# Patient Record
Sex: Female | Born: 1970
Health system: Southern US, Community
[De-identification: ages and names within clinical notes are randomized; demographics above are authoritative.]

## PROBLEM LIST (undated history)

## (undated) DIAGNOSIS — Z8619 Personal history of other infectious and parasitic diseases: Secondary | ICD-10-CM

## (undated) DIAGNOSIS — I10 Essential (primary) hypertension: Secondary | ICD-10-CM

## (undated) DIAGNOSIS — G250 Essential tremor: Secondary | ICD-10-CM

## (undated) DIAGNOSIS — E079 Disorder of thyroid, unspecified: Secondary | ICD-10-CM

## (undated) HISTORY — DX: Personal history of other infectious and parasitic diseases: Z86.19

## (undated) HISTORY — DX: Disorder of thyroid, unspecified: E07.9

## (undated) HISTORY — DX: Essential tremor: G25.0

## (undated) HISTORY — PX: BREAST ENHANCEMENT SURGERY: SHX7

## (undated) HISTORY — DX: Essential (primary) hypertension: I10

## (undated) HISTORY — PX: WISDOM TOOTH EXTRACTION: SHX21

---

## 1998-03-11 ENCOUNTER — Other Ambulatory Visit: Admission: RE | Admit: 1998-03-11 | Discharge: 1998-03-11 | Payer: Self-pay | Admitting: Obstetrics and Gynecology

## 1998-04-02 ENCOUNTER — Other Ambulatory Visit: Admission: RE | Admit: 1998-04-02 | Discharge: 1998-04-02 | Payer: Self-pay | Admitting: Obstetrics and Gynecology

## 1998-08-29 ENCOUNTER — Other Ambulatory Visit: Admission: RE | Admit: 1998-08-29 | Discharge: 1998-08-29 | Payer: Self-pay | Admitting: Obstetrics and Gynecology

## 1999-08-04 ENCOUNTER — Other Ambulatory Visit: Admission: RE | Admit: 1999-08-04 | Discharge: 1999-08-04 | Payer: Self-pay | Admitting: Obstetrics & Gynecology

## 2001-03-15 ENCOUNTER — Other Ambulatory Visit: Admission: RE | Admit: 2001-03-15 | Discharge: 2001-03-15 | Payer: Self-pay | Admitting: Obstetrics & Gynecology

## 2002-09-01 ENCOUNTER — Other Ambulatory Visit: Admission: RE | Admit: 2002-09-01 | Discharge: 2002-09-01 | Payer: Self-pay | Admitting: Obstetrics & Gynecology

## 2003-09-03 ENCOUNTER — Other Ambulatory Visit: Admission: RE | Admit: 2003-09-03 | Discharge: 2003-09-03 | Payer: Self-pay | Admitting: Obstetrics & Gynecology

## 2004-09-10 ENCOUNTER — Other Ambulatory Visit: Admission: RE | Admit: 2004-09-10 | Discharge: 2004-09-10 | Payer: Self-pay | Admitting: Obstetrics & Gynecology

## 2010-12-12 ENCOUNTER — Other Ambulatory Visit: Payer: Self-pay | Admitting: Obstetrics & Gynecology

## 2010-12-12 DIAGNOSIS — R928 Other abnormal and inconclusive findings on diagnostic imaging of breast: Secondary | ICD-10-CM

## 2010-12-22 ENCOUNTER — Ambulatory Visit
Admission: RE | Admit: 2010-12-22 | Discharge: 2010-12-22 | Disposition: A | Discharge status: Home / Self Care | Payer: BC Managed Care – PPO | Source: Ambulatory Visit | Attending: Obstetrics & Gynecology | Admitting: Obstetrics & Gynecology

## 2010-12-22 DIAGNOSIS — R928 Other abnormal and inconclusive findings on diagnostic imaging of breast: Secondary | ICD-10-CM

## 2011-12-02 ENCOUNTER — Other Ambulatory Visit: Payer: Self-pay | Admitting: Obstetrics & Gynecology

## 2011-12-02 DIAGNOSIS — N63 Unspecified lump in unspecified breast: Secondary | ICD-10-CM

## 2011-12-10 ENCOUNTER — Ambulatory Visit
Admission: RE | Admit: 2011-12-10 | Discharge: 2011-12-10 | Disposition: A | Discharge status: Home / Self Care | Payer: BC Managed Care – PPO | Source: Ambulatory Visit | Attending: Obstetrics & Gynecology | Admitting: Obstetrics & Gynecology

## 2011-12-10 DIAGNOSIS — N63 Unspecified lump in unspecified breast: Secondary | ICD-10-CM

## 2012-12-06 ENCOUNTER — Ambulatory Visit: Payer: BC Managed Care – PPO | Admitting: Internal Medicine

## 2012-12-08 ENCOUNTER — Ambulatory Visit (INDEPENDENT_AMBULATORY_CARE_PROVIDER_SITE_OTHER): Payer: BC Managed Care – PPO | Admitting: Internal Medicine

## 2012-12-08 ENCOUNTER — Other Ambulatory Visit (INDEPENDENT_AMBULATORY_CARE_PROVIDER_SITE_OTHER): Payer: BC Managed Care – PPO

## 2012-12-08 ENCOUNTER — Encounter: Payer: Self-pay | Admitting: Internal Medicine

## 2012-12-08 VITALS — BP 120/68 | HR 70 | Temp 99.8°F | Ht 64.0 in | Wt 131.2 lb

## 2012-12-08 DIAGNOSIS — Z23 Encounter for immunization: Secondary | ICD-10-CM

## 2012-12-08 DIAGNOSIS — Z1329 Encounter for screening for other suspected endocrine disorder: Secondary | ICD-10-CM

## 2012-12-08 DIAGNOSIS — Z130 Encounter for screening for diseases of the blood and blood-forming organs and certain disorders involving the immune mechanism: Secondary | ICD-10-CM

## 2012-12-08 DIAGNOSIS — H608X3 Other otitis externa, bilateral: Secondary | ICD-10-CM | POA: Insufficient documentation

## 2012-12-08 DIAGNOSIS — Z0000 Encounter for general adult medical examination without abnormal findings: Secondary | ICD-10-CM

## 2012-12-08 DIAGNOSIS — Z13220 Encounter for screening for lipoid disorders: Secondary | ICD-10-CM

## 2012-12-08 LAB — CBC
HCT: 39.2 % (ref 36.0–46.0)
Hemoglobin: 13.3 g/dL (ref 12.0–15.0)
MCHC: 34 g/dL (ref 30.0–36.0)
RDW: 13 % (ref 11.5–14.6)

## 2012-12-08 LAB — COMPREHENSIVE METABOLIC PANEL
ALT: 17 U/L (ref 0–35)
AST: 21 U/L (ref 0–37)
Alkaline Phosphatase: 27 U/L — ABNORMAL LOW (ref 39–117)
Creatinine, Ser: 0.8 mg/dL (ref 0.4–1.2)
Total Bilirubin: 1.1 mg/dL (ref 0.3–1.2)

## 2012-12-08 LAB — LIPID PANEL
HDL: 62.2 mg/dL (ref >39.00)
LDL Cholesterol: 118 mg/dL — ABNORMAL HIGH (ref 0–99)
Total CHOL/HDL Ratio: 3
VLDL: 15.4 mg/dL (ref 0.0–40.0)

## 2012-12-08 LAB — TSH: TSH: 3.7 u[IU]/mL (ref 0.35–5.50)

## 2012-12-08 MED ORDER — NEOMYCIN-POLYMYXIN-HC 1 % OT SOLN: 3.0000 [drp] | Freq: Four times a day (QID) | OTIC | Status: DC | PRN

## 2012-12-08 NOTE — Addendum Note (Signed)
Addended by: Edwena Felty T on: 12/08/2012 09:15 AM   Modules accepted: Orders

## 2012-12-08 NOTE — Patient Instructions (Signed)

## 2012-12-08 NOTE — Assessment & Plan Note (Signed)
eRx for Cortisporin ear drops

## 2012-12-08 NOTE — Progress Notes (Signed)
HPI  Pt presents to the clinic today to establish care. She has not seen a PCP in more than 10 years. She does have some concerns today about feeling itchy inside her ears. This has been going on for the last 6 months. She has not put anything in her ears. She has no history of allergies that she is aware of. She denies ear pain or drainage from the ears.  Flu: never Tetanus: more than 10 years ago LMP: 09/2012 Pap smear: 11/2011 Mammogram: 11/2011 Eye doctor: annually Dentist: annually  Past Medical History  Diagnosis Date  . History of chicken pox     Current Outpatient Prescriptions  Medication Sig Dispense Refill  . CAMRESE 0.15-0.03 &0.01 MG tablet Take 1 by mouth daily       No current facility-administered medications for this visit.    No Known Allergies  Family History  Problem Relation Age of Onset  . Hypertension Father   . Hyperlipidemia Father   . Hypertension Sister     History   Social History  . Marital Status: Unknown    Spouse Name: N/A    Number of Children: N/A  . Years of Education: N/A   Occupational History  . Not on file.   Social History Main Topics  . Smoking status: Former Smoker    Types: Cigarettes  . Smokeless tobacco: Not on file     Comment: Quit over 18 years ago  . Alcohol Use: Yes  . Drug Use: No  . Sexual Activity: Not on file   Other Topics Concern  . Not on file   Social History Narrative  . No narrative on file    ROS:  Constitutional: Denies fever, malaise, fatigue, headache or abrupt weight changes.  HEENT: Pt reports itchy ears. Denies eye pain, eye redness, ear pain, ringing in the ears, wax buildup, runny nose, nasal congestion, bloody nose, or sore throat. Respiratory: Denies difficulty breathing, shortness of breath, cough or sputum production.   Cardiovascular: Denies chest pain, chest tightness, palpitations or swelling in the hands or feet.  Gastrointestinal: Denies abdominal pain, bloating, constipation,  diarrhea or blood in the stool.  GU: Denies frequency, urgency, pain with urination, blood in urine, odor or discharge. Musculoskeletal: Denies decrease in range of motion, difficulty with gait, muscle pain or joint pain and swelling.  Skin: Denies redness, rashes, lesions or ulcercations.  Neurological: Denies dizziness, difficulty with memory, difficulty with speech or problems with balance and coordination.   No other specific complaints in a complete review of systems (except as listed in HPI above).  PE:  BP 120/68  Pulse 70  Temp(Src) 99.8 F (37.7 C) (Oral)  Ht 5\' 4"  (1.626 m)  Wt 131 lb 3.2 oz (59.512 kg)  BMI 22.51 kg/m2  SpO2 98% Wt Readings from Last 3 Encounters:  12/08/12 131 lb 3.2 oz (59.512 kg)    General: Appears her stated age, well developed, well nourished in NAD. HEENT: Head: normal shape and size; Eyes: sclera white, no icterus, conjunctiva pink, PERRLA and EOMs intact; Ears: dry skin noted in each ear canal, Tm's gray and intact, normal light reflex; Nose: mucosa pink and moist, septum midline; Throat/Mouth: Teeth present, mucosa pink and moist, no lesions or ulcerations noted.  Neck: Normal range of motion. Neck supple, trachea midline. No massses, lumps or thyromegaly present.  Cardiovascular: Normal rate and rhythm. S1,S2 noted.  No murmur, rubs or gallops noted. No JVD or BLE edema. No carotid bruits noted. Pulmonary/Chest: Normal  effort and positive vesicular breath sounds. No respiratory distress. No wheezes, rales or ronchi noted.  Abdomen: Soft and nontender. Normal bowel sounds, no bruits noted. No distention or masses noted. Liver, spleen and kidneys non palpable. Musculoskeletal: Normal range of motion. No signs of joint swelling. No difficulty with gait.  Neurological: Alert and oriented. Cranial nerves II-XII intact. Coordination normal. +DTRs bilaterally. Psychiatric: Mood and affect normal. Behavior is normal. Judgment and thought content normal.       Assessment and Plan:  Prevent Health Maintenance:  Pt declines flu shot today Will give Tdap All HM UTD Will obtain screening labs today  RTC in 1 year or sooner if needed

## 2012-12-09 ENCOUNTER — Encounter: Payer: Self-pay | Admitting: *Deleted

## 2014-08-02 ENCOUNTER — Ambulatory Visit (INDEPENDENT_AMBULATORY_CARE_PROVIDER_SITE_OTHER): Payer: BLUE CROSS/BLUE SHIELD | Admitting: Family

## 2014-08-02 ENCOUNTER — Encounter: Payer: Self-pay | Admitting: Family

## 2014-08-02 VITALS — BP 120/82 | HR 62 | Temp 98.5°F | Resp 18 | Ht 64.0 in | Wt 125.1 lb

## 2014-08-02 DIAGNOSIS — H60543 Acute eczematoid otitis externa, bilateral: Secondary | ICD-10-CM

## 2014-08-02 DIAGNOSIS — H608X3 Other otitis externa, bilateral: Secondary | ICD-10-CM

## 2014-08-02 MED ORDER — NEOMYCIN-POLYMYXIN-HC 1 % OT SOLN: 3.0000 [drp] | Freq: Four times a day (QID) | OTIC | Status: DC | PRN

## 2014-08-02 NOTE — Progress Notes (Signed)
Pre visit review using our clinic review tool, if applicable. No additional management support is needed unless otherwise documented below in the visit note. 

## 2014-08-02 NOTE — Progress Notes (Signed)
   Subjective:    Patient ID: Krystal Myers, female    DOB: 1970-08-20, 44 y.o.   MRN: [id number]  Chief Complaint  Patient presents with  . Establish Care    x3 weeks has been having trouble with her ears, more in the left ear than the right, hears muffled sounds and describes it as a cross between having water in her ears and the popping sounds    HPI:  Krystal Myers is a 44 y.o. female with a PMH of eczematous otitis externa  who presents today for an office visit to establish care with this provider.  1) Ears - Associated symptom changes in hearing located in both ears has been going on for about 3 weeks. Described hearing as muffled and occasionally feeling like she has water in her ears. Modifying factors include Zyrtec which did not help a lot. Occasionally notes liquid coming from her ear.                No Known Allergies  Current Outpatient Prescriptions on File Prior to Visit  Medication Sig Dispense Refill  . CAMRESE 0.15-0.03 &0.01 MG tablet Take 1 by mouth daily     No current facility-administered medications on file prior to visit.    Review of Systems  Constitutional: Negative for fever and chills.  HENT: Positive for ear discharge. Negative for congestion and ear pain.       Objective:    BP 120/82 mmHg  Pulse 62  Temp(Src) 98.5 F (36.9 C) (Oral)  Resp 18  Ht 5\' 4"  (1.626 m)  Wt 125 lb 1.9 oz (56.754 kg)  BMI 21.47 kg/m2  SpO2 99% Nursing note and vital signs reviewed.  Physical Exam  Constitutional: She is oriented to person, place, and time. She appears well-developed and well-nourished. No distress.  HENT:  Right Ear: Hearing, tympanic membrane, external ear and ear canal normal. No drainage or tenderness.  Left Ear: Hearing, tympanic membrane, external ear and ear canal normal. No drainage or tenderness.  Mild dryness noted bilaterally  Cardiovascular: Normal rate, regular rhythm, normal heart sounds and intact distal pulses.     Pulmonary/Chest: Effort normal and breath sounds normal.  Neurological: She is alert and oriented to person, place, and time.  Skin: Skin is warm and dry.  Psychiatric: She has a normal mood and affect. Her behavior is normal. Judgment and thought content normal.       Assessment & Plan:

## 2014-08-02 NOTE — Patient Instructions (Signed)
Thank you for choosing [facility].  Summary/Instructions:  Your prescription(s) have been submitted to your pharmacy or been printed and provided for you. Please take as directed and contact our office if you believe you are having problem(s) with the medication(s) or have any questions.  If your symptoms worsen or fail to improve, please contact our office for further instruction, or in case of emergency go directly to the emergency room at the closest medical facility.     

## 2014-08-02 NOTE — Assessment & Plan Note (Signed)
Patient continues to experience dryness and itching of both ears. Refill Cortisporin. Muffled sounds most likely related to eustachian tube dysfunction or increased fluid. Trial Flonase and second-generation antihistamine. If this is not improved symptoms, consider referral to ENT

## 2015-12-27 ENCOUNTER — Encounter: Payer: BLUE CROSS/BLUE SHIELD | Admitting: Family

## 2016-01-13 ENCOUNTER — Encounter: Payer: Self-pay | Admitting: Family

## 2016-01-13 ENCOUNTER — Ambulatory Visit (INDEPENDENT_AMBULATORY_CARE_PROVIDER_SITE_OTHER): Payer: BLUE CROSS/BLUE SHIELD | Admitting: Family

## 2016-01-13 DIAGNOSIS — Z0000 Encounter for general adult medical examination without abnormal findings: Secondary | ICD-10-CM | POA: Diagnosis not present

## 2016-01-13 NOTE — Assessment & Plan Note (Signed)
1) Anticipatory Guidance: Discussed importance of wearing a seatbelt while driving and not texting while driving; changing batteries in smoke detector at least once annually; wearing suntan lotion when outside; eating a balanced and moderate diet; getting physical activity at least 30 minutes per day.  2) Immunizations / Screenings / Labs:  Declines influenza. All other immunizations are up to date per recommendations. Obtain Vitamin D for Vitamin D deficiency screening. All other screenings are up to date per recommendations. Obtain CBC, CMET, and lipid profile.    Overall well exam with risk factors for cardiovascular disease being minimal at this time. She is of good weight. Is exercising on a regular basis. Continue healthy lifestyle behaviors and choices. Follow-up prevention exam in 1 year. Follow-up office visit pending blood work if necessary.

## 2016-01-13 NOTE — Progress Notes (Signed)
Subjective:    Patient ID: Krystal Myers, female    DOB: October 13, 1970, 45 y.o.   MRN: [id number]  Chief Complaint  Patient presents with  . CPE    not fasting    HPI:  Krystal Myers is a 45 y.o. female who presents today for an annual wellness visit.   1) Health Maintenance -   Diet - Averages about 3 meals per day with some snacks consisting of regular diet. Caffeine intake of 1-2 cups per week.   Exercise - Goes to the gym 6x per week with a mix of cardio and resistance.   2) Preventative Exams / Immunizations:  Dental -- Up to date  Vision -- Up to date   Health Maintenance  Topic Date Due  . HIV Screening  10/07/1983  . INFLUENZA VACCINE  06/27/2016 (Originally 10/29/2015)  . PAP SMEAR  01/28/2018  . TETANUS/TDAP  12/09/2022    Immunization History  Administered Date(s) Administered  . Tdap 12/08/2012     No Known Allergies   Outpatient Medications Prior to Visit  Medication Sig Dispense Refill  . CAMRESE 0.15-0.03 &0.01 MG tablet Take 1 by mouth daily    . NEOMYCIN-POLYMYXIN-HYDROCORTISONE (CORTISPORIN) 1 % SOLN otic solution Place 3 drops into both ears 4 (four) times daily as needed. 10 mL 1   No facility-administered medications prior to visit.      Past Medical History:  Diagnosis Date  . History of chicken pox      Past Surgical History:  Procedure Laterality Date  . BREAST ENHANCEMENT SURGERY       Family History  Problem Relation Age of Onset  . Hypertension Father   . Hyperlipidemia Father   . Hypertension Sister   . Healthy Mother   . Healthy Maternal Grandmother   . Healthy Maternal Grandfather   . Healthy Paternal Grandmother   . Healthy Paternal Grandfather      Social History   Social History  . Marital status: Single    Spouse name: N/A  . Number of children: 0  . Years of education: 14   Occupational History  . Supervisor    Social History Main Topics  . Smoking status: Former Smoker    Types:  Cigarettes  . Smokeless tobacco: Never Used     Comment: Quit over 18 years ago  . Alcohol use 0.6 oz/week    1 Glasses of wine per week  . Drug use: No  . Sexual activity: Yes    Birth control/ protection: Pill   Other Topics Concern  . Not on file   Social History Narrative   Fun: [employer] work, walk with her dogs   Denies abuse and feels safe at home.       Review of Systems  Constitutional: Denies fever, chills, fatigue, or significant weight gain/loss. HENT: Head: Denies headache or neck pain Ears: Denies changes in hearing, ringing in ears, earache, drainage Nose: Denies discharge, stuffiness, itching, nosebleed, sinus pain Throat: Denies sore throat, hoarseness, dry mouth, sores, thrush Eyes: Denies loss/changes in vision, pain, redness, blurry/double vision, flashing lights Cardiovascular: Denies chest pain/discomfort, tightness, palpitations, shortness of breath with activity, difficulty lying down, swelling, sudden awakening with shortness of breath Respiratory: Denies shortness of breath, cough, sputum production, wheezing Gastrointestinal: Denies dysphasia, heartburn, change in appetite, nausea, change in bowel habits, rectal bleeding, constipation, diarrhea, yellow skin or eyes Genitourinary: Denies frequency, urgency, burning/pain, blood in urine, incontinence, change in urinary strength. Musculoskeletal: Denies muscle/joint pain, stiffness, back pain,  redness or swelling of joints, trauma Skin: Denies rashes, lumps, itching, dryness, color changes, or hair/nail changes Neurological: Denies dizziness, fainting, seizures, weakness, numbness, tingling, tremor Psychiatric - Denies nervousness, stress, depression or memory loss Endocrine: Denies heat or cold intolerance, sweating, frequent urination, excessive thirst, changes in appetite Hematologic: Denies ease of bruising or bleeding     Objective:    BP 132/84 (BP Location: Left Arm, Patient Position: Sitting, Cuff  Size: Normal)   Pulse 71   Temp 98.3 F (36.8 C) (Oral)   Resp 16   Ht 5\' 4"  (1.626 m)   Wt 146 lb 12.8 oz (66.6 kg)   SpO2 99%   BMI 25.20 kg/m  Nursing note and vital signs reviewed.  Physical Exam  Constitutional: She is oriented to person, place, and time. She appears well-developed and well-nourished.  HENT:  Head: Normocephalic.  Right Ear: Hearing, tympanic membrane, external ear and ear canal normal.  Left Ear: Hearing, tympanic membrane, external ear and ear canal normal.  Nose: Nose normal.  Mouth/Throat: Uvula is midline, oropharynx is clear and moist and mucous membranes are normal.  Eyes: Conjunctivae and EOM are normal. Pupils are equal, round, and reactive to light.  Neck: Neck supple. No JVD present. No tracheal deviation present. No thyromegaly present.  Cardiovascular: Normal rate, regular rhythm, normal heart sounds and intact distal pulses.   Pulmonary/Chest: Effort normal and breath sounds normal.  Abdominal: Soft. Bowel sounds are normal. She exhibits no distension and no mass. There is no tenderness. There is no rebound and no guarding.  Musculoskeletal: Normal range of motion. She exhibits no edema or tenderness.  Lymphadenopathy:    She has no cervical adenopathy.  Neurological: She is alert and oriented to person, place, and time. She has normal reflexes. No cranial nerve deficit. She exhibits normal muscle tone. Coordination normal.  Skin: Skin is warm and dry.  Psychiatric: She has a normal mood and affect. Her behavior is normal. Judgment and thought content normal.       Assessment & Plan:   Problem List Items Addressed This Visit      Other   Routine general medical examination at a health care facility    1) Anticipatory Guidance: Discussed importance of wearing a seatbelt while driving and not texting while driving; changing batteries in smoke detector at least once annually; wearing suntan lotion when outside; eating a balanced and moderate  diet; getting physical activity at least 30 minutes per day.  2) Immunizations / Screenings / Labs:  Declines influenza. All other immunizations are up to date per recommendations. Obtain Vitamin D for Vitamin D deficiency screening. All other screenings are up to date per recommendations. Obtain CBC, CMET, and lipid profile.    Overall well exam with risk factors for cardiovascular disease being minimal at this time. She is of good weight. Is exercising on a regular basis. Continue healthy lifestyle behaviors and choices. Follow-up prevention exam in 1 year. Follow-up office visit pending blood work if necessary.       Relevant Orders   CBC   Comprehensive metabolic panel   Lipid panel   VITAMIN D 25 Hydroxy (Vit-D Deficiency, Fractures)    Other Visit Diagnoses   None.      I have discontinued Ms. Chamberland's NEOMYCIN-POLYMYXIN-HYDROCORTISONE. I am also having her maintain her CAMRESE.   Follow-up: Return in about 6 months (around 07/13/2016), or if symptoms worsen or fail to improve.   Mauricio Po, FNP

## 2016-01-13 NOTE — Patient Instructions (Addendum)
Thank you for choosing [facility].  SUMMARY AND INSTRUCTIONS:   Labs:  Please stop by the lab on the lower level of the building for your blood work. Your results will be released to MyChart (or called to you) after review, usually within 72 hours after test completion. If any changes need to be made, you will be notified at that same time.  1.) The lab is open from 7:30am to 5:30 pm Monday-Friday 2.) No appointment is necessary 3.) Fasting (if needed) is 6-8 hours after food and drink; black coffee and water are okay   Health Maintenance, Female Adopting a healthy lifestyle and getting preventive care can go a long way to promote health and wellness. Talk with your health care provider about what schedule of regular examinations is right for you. This is a good chance for you to check in with your provider about disease prevention and staying healthy. In between checkups, there are plenty of things you can do on your own. Experts have done a lot of research about which lifestyle changes and preventive measures are most likely to keep you healthy. Ask your health care provider for more information. WEIGHT AND DIET  Eat a healthy diet  Be sure to include plenty of vegetables, fruits, low-fat dairy products, and lean protein.  Do not eat a lot of foods high in solid fats, added sugars, or salt.  Get regular exercise. This is one of the most important things you can do for your health.  Most adults should exercise for at least 150 minutes each week. The exercise should increase your heart rate and make you sweat (moderate-intensity exercise).  Most adults should also do strengthening exercises at least twice a week. This is in addition to the moderate-intensity exercise.  Maintain a healthy weight  Body mass index (BMI) is a measurement that can be used to identify possible weight problems. It estimates body fat based on height and weight. Your health care provider can help  determine your BMI and help you achieve or maintain a healthy weight.  For females 20 years of age and older:   A BMI below 18.5 is considered underweight.  A BMI of 18.5 to 24.9 is normal.  A BMI of 25 to 29.9 is considered overweight.  A BMI of 30 and above is considered obese.  Watch levels of cholesterol and blood lipids  You should start having your blood tested for lipids and cholesterol at 47 years of age, then have this test every 5 years.  You may need to have your cholesterol levels checked more often if:  Your lipid or cholesterol levels are high.  You are older than 47 years of age.  You are at high risk for heart disease.  CANCER SCREENING   Lung Cancer  Lung cancer screening is recommended for adults 55-80 years old who are at high risk for lung cancer because of a history of smoking.  A yearly low-dose CT scan of the lungs is recommended for people who:  Currently smoke.  Have quit within the past 15 years.  Have at least a 30-pack-year history of smoking. A pack year is smoking an average of one pack of cigarettes a day for 1 year.  Yearly screening should continue until it has been 15 years since you quit.  Yearly screening should stop if you develop a health problem that would prevent you from having lung cancer treatment.  Breast Cancer  Practice breast self-awareness. This means understanding how your   breasts normally appear and feel.  It also means doing regular breast self-exams. Let your health care provider know about any changes, no matter how small.  If you are in your 20s or 30s, you should have a clinical breast exam (CBE) by a health care provider every 1-3 years as part of a regular health exam.  If you are 40 or older, have a CBE every year. Also consider having a breast X-ray (mammogram) every year.  If you have a family history of breast cancer, talk to your health care provider about genetic screening.  If you are at high risk  for breast cancer, talk to your health care provider about having an MRI and a mammogram every year.  Breast cancer gene (BRCA) assessment is recommended for women who have family members with BRCA-related cancers. BRCA-related cancers include:  Breast.  Ovarian.  Tubal.  Peritoneal cancers.  Results of the assessment will determine the need for genetic counseling and BRCA1 and BRCA2 testing. Cervical Cancer Your health care provider may recommend that you be screened regularly for cancer of the pelvic organs (ovaries, uterus, and vagina). This screening involves a pelvic examination, including checking for microscopic changes to the surface of your cervix (Pap test). You may be encouraged to have this screening done every 3 years, beginning at age 21.  For women ages 30-65, health care providers may recommend pelvic exams and Pap testing every 3 years, or they may recommend the Pap and pelvic exam, combined with testing for human papilloma virus (HPV), every 5 years. Some types of HPV increase your risk of cervical cancer. Testing for HPV may also be done on women of any age with unclear Pap test results.  Other health care providers may not recommend any screening for nonpregnant women who are considered low risk for pelvic cancer and who do not have symptoms. Ask your health care provider if a screening pelvic exam is right for you.  If you have had past treatment for cervical cancer or a condition that could lead to cancer, you need Pap tests and screening for cancer for at least 20 years after your treatment. If Pap tests have been discontinued, your risk factors (such as having a new sexual partner) need to be reassessed to determine if screening should resume. Some women have medical problems that increase the chance of getting cervical cancer. In these cases, your health care provider may recommend more frequent screening and Pap tests. Colorectal Cancer  This type of cancer can be  detected and often prevented.  Routine colorectal cancer screening usually begins at 47 years of age and continues through 47 years of age.  Your health care provider may recommend screening at an earlier age if you have risk factors for colon cancer.  Your health care provider may also recommend using home test kits to check for hidden blood in the stool.  A small camera at the end of a tube can be used to examine your colon directly (sigmoidoscopy or colonoscopy). This is done to check for the earliest forms of colorectal cancer.  Routine screening usually begins at age 50.  Direct examination of the colon should be repeated every 5-10 years through 47 years of age. However, you may need to be screened more often if early forms of precancerous polyps or small growths are found. Skin Cancer  Check your skin from head to toe regularly.  Tell your health care provider about any new moles or changes in moles, especially if   there is a change in a mole's shape or color.  Also tell your health care provider if you have a mole that is larger than the size of a pencil eraser.  Always use sunscreen. Apply sunscreen liberally and repeatedly throughout the day.  Protect yourself by wearing long sleeves, pants, a wide-brimmed hat, and sunglasses whenever you are outside. HEART DISEASE, DIABETES, AND HIGH BLOOD PRESSURE   High blood pressure causes heart disease and increases the risk of stroke. High blood pressure is more likely to develop in:  People who have blood pressure in the high end of the normal range (130-139/85-89 mm Hg).  People who are overweight or obese.  People who are African American.  If you are 18-39 years of age, have your blood pressure checked every 3-5 years. If you are 40 years of age or older, have your blood pressure checked every year. You should have your blood pressure measured twice--once when you are at a hospital or clinic, and once when you are not at a  hospital or clinic. Record the average of the two measurements. To check your blood pressure when you are not at a hospital or clinic, you can use:  An automated blood pressure machine at a pharmacy.  A home blood pressure monitor.  If you are between 55 years and 79 years old, ask your health care provider if you should take aspirin to prevent strokes.  Have regular diabetes screenings. This involves taking a blood sample to check your fasting blood sugar level.  If you are at a normal weight and have a low risk for diabetes, have this test once every three years after 47 years of age.  If you are overweight and have a high risk for diabetes, consider being tested at a younger age or more often. PREVENTING INFECTION  Hepatitis B  If you have a higher risk for hepatitis B, you should be screened for this virus. You are considered at high risk for hepatitis B if:  You were born in a country where hepatitis B is common. Ask your health care provider which countries are considered high risk.  Your parents were born in a high-risk country, and you have not been immunized against hepatitis B (hepatitis B vaccine).  You have HIV or AIDS.  You use needles to inject street drugs.  You live with someone who has hepatitis B.  You have had sex with someone who has hepatitis B.  You get hemodialysis treatment.  You take certain medicines for conditions, including cancer, organ transplantation, and autoimmune conditions. Hepatitis C  Blood testing is recommended for:  Everyone born from 1945 through 1965.  Anyone with known risk factors for hepatitis C. Sexually transmitted infections (STIs)  You should be screened for sexually transmitted infections (STIs) including gonorrhea and chlamydia if:  You are sexually active and are younger than 47 years of age.  You are older than 47 years of age and your health care provider tells you that you are at risk for this type of  infection.  Your sexual activity has changed since you were last screened and you are at an increased risk for chlamydia or gonorrhea. Ask your health care provider if you are at risk.  If you do not have HIV, but are at risk, it may be recommended that you take a prescription medicine daily to prevent HIV infection. This is called pre-exposure prophylaxis (PrEP). You are considered at risk if:  You are sexually active and do   not regularly use condoms or know the HIV status of your partner(s).  You take drugs by injection.  You are sexually active with a partner who has HIV. Talk with your health care provider about whether you are at high risk of being infected with HIV. If you choose to begin PrEP, you should first be tested for HIV. You should then be tested every 3 months for as long as you are taking PrEP.  PREGNANCY   If you are premenopausal and you may become pregnant, ask your health care provider about preconception counseling.  If you may become pregnant, take 400 to 800 micrograms (mcg) of folic acid every day.  If you want to prevent pregnancy, talk to your health care provider about birth control (contraception). OSTEOPOROSIS AND MENOPAUSE   Osteoporosis is a disease in which the bones lose minerals and strength with aging. This can result in serious bone fractures. Your risk for osteoporosis can be identified using a bone density scan.  If you are 65 years of age or older, or if you are at risk for osteoporosis and fractures, ask your health care provider if you should be screened.  Ask your health care provider whether you should take a calcium or vitamin D supplement to lower your risk for osteoporosis.  Menopause may have certain physical symptoms and risks.  Hormone replacement therapy may reduce some of these symptoms and risks. Talk to your health care provider about whether hormone replacement therapy is right for you.  HOME CARE INSTRUCTIONS   Schedule regular  health, dental, and eye exams.  Stay current with your immunizations.   Do not use any tobacco products including cigarettes, chewing tobacco, or electronic cigarettes.  If you are pregnant, do not drink alcohol.  If you are breastfeeding, limit how much and how often you drink alcohol.  Limit alcohol intake to no more than 1 drink per day for nonpregnant women. One drink equals 12 ounces of beer, 5 ounces of wine, or 1 ounces of hard liquor.  Do not use street drugs.  Do not share needles.  Ask your health care provider for help if you need support or information about quitting drugs.  Tell your health care provider if you often feel depressed.  Tell your health care provider if you have ever been abused or do not feel safe at home.   This information is not intended to replace advice given to you by your health care provider. Make sure you discuss any questions you have with your health care provider.   Document Released: 09/29/2010 Document Revised: 04/06/2014 Document Reviewed: 02/15/2013 Elsevier Interactive Patient Education 2016 [employer]   

## 2016-01-31 ENCOUNTER — Other Ambulatory Visit (INDEPENDENT_AMBULATORY_CARE_PROVIDER_SITE_OTHER): Payer: BLUE CROSS/BLUE SHIELD

## 2016-01-31 DIAGNOSIS — Z0000 Encounter for general adult medical examination without abnormal findings: Secondary | ICD-10-CM

## 2016-01-31 LAB — COMPREHENSIVE METABOLIC PANEL
ALK PHOS: 37 U/L — AB (ref 39–117)
ALT: 15 U/L (ref 0–35)
AST: 18 U/L (ref 0–37)
Albumin: 4 g/dL (ref 3.5–5.2)
BILIRUBIN TOTAL: 1 mg/dL (ref 0.2–1.2)
BUN: 19 mg/dL (ref 6–23)
CALCIUM: 9.3 mg/dL (ref 8.4–10.5)
CO2: 28 meq/L (ref 19–32)
Chloride: 106 mEq/L (ref 96–112)
Creatinine, Ser: 0.78 mg/dL (ref 0.40–1.20)
GFR: 84.03 mL/min (ref >60.00)
Glucose, Bld: 100 mg/dL — ABNORMAL HIGH (ref 70–99)
Potassium: 5 mEq/L (ref 3.5–5.1)
Sodium: 141 mEq/L (ref 135–145)
TOTAL PROTEIN: 7 g/dL (ref 6.0–8.3)

## 2016-01-31 LAB — CBC
HCT: 41.8 % (ref 36.0–46.0)
HEMOGLOBIN: 14.4 g/dL (ref 12.0–15.0)
MCHC: 34.5 g/dL (ref 30.0–36.0)
MCV: 93.4 fl (ref 78.0–100.0)
PLATELETS: 304 10*3/uL (ref 150.0–400.0)
RBC: 4.48 Mil/uL (ref 3.87–5.11)
RDW: 12.3 % (ref 11.5–15.5)
WBC: 6.8 10*3/uL (ref 4.0–10.5)

## 2016-01-31 LAB — LIPID PANEL
CHOLESTEROL: 219 mg/dL — AB (ref 0–200)
HDL: 71.4 mg/dL (ref >39.00)
LDL Cholesterol: 131 mg/dL — ABNORMAL HIGH (ref 0–99)
NonHDL: 148.03
TRIGLYCERIDES: 84 mg/dL (ref 0.0–149.0)
Total CHOL/HDL Ratio: 3
VLDL: 16.8 mg/dL (ref 0.0–40.0)

## 2016-01-31 LAB — VITAMIN D 25 HYDROXY (VIT D DEFICIENCY, FRACTURES): VITD: 57.53 ng/mL (ref 30.00–100.00)

## 2016-02-04 ENCOUNTER — Encounter: Payer: Self-pay | Admitting: Family

## 2018-04-20 DIAGNOSIS — R030 Elevated blood-pressure reading, without diagnosis of hypertension: Secondary | ICD-10-CM | POA: Insufficient documentation

## 2018-04-20 DIAGNOSIS — N393 Stress incontinence (female) (male): Secondary | ICD-10-CM | POA: Insufficient documentation

## 2018-10-05 ENCOUNTER — Ambulatory Visit (INDEPENDENT_AMBULATORY_CARE_PROVIDER_SITE_OTHER): Payer: 59 | Admitting: Family

## 2018-10-05 ENCOUNTER — Ambulatory Visit (INDEPENDENT_AMBULATORY_CARE_PROVIDER_SITE_OTHER)
Admission: RE | Admit: 2018-10-05 | Discharge: 2018-10-05 | Disposition: A | Discharge status: Home / Self Care | Payer: 59 | Source: Ambulatory Visit | Attending: Family | Admitting: Family

## 2018-10-05 ENCOUNTER — Other Ambulatory Visit: Payer: Self-pay

## 2018-10-05 ENCOUNTER — Other Ambulatory Visit (INDEPENDENT_AMBULATORY_CARE_PROVIDER_SITE_OTHER): Payer: 59

## 2018-10-05 ENCOUNTER — Encounter: Payer: Self-pay | Admitting: Family

## 2018-10-05 VITALS — BP 150/92 | HR 102 | Temp 98.5°F | Ht 64.0 in | Wt 162.8 lb

## 2018-10-05 DIAGNOSIS — R5383 Other fatigue: Secondary | ICD-10-CM

## 2018-10-05 DIAGNOSIS — Z0000 Encounter for general adult medical examination without abnormal findings: Secondary | ICD-10-CM | POA: Diagnosis not present

## 2018-10-05 DIAGNOSIS — G8929 Other chronic pain: Secondary | ICD-10-CM

## 2018-10-05 DIAGNOSIS — Z13220 Encounter for screening for lipoid disorders: Secondary | ICD-10-CM | POA: Diagnosis not present

## 2018-10-05 DIAGNOSIS — M5442 Lumbago with sciatica, left side: Secondary | ICD-10-CM | POA: Diagnosis not present

## 2018-10-05 LAB — COMPREHENSIVE METABOLIC PANEL
ALT: 14 U/L (ref 0–35)
AST: 16 U/L (ref 0–37)
Albumin: 4.1 g/dL (ref 3.5–5.2)
Alkaline Phosphatase: 43 U/L (ref 39–117)
BUN: 18 mg/dL (ref 6–23)
CO2: 25 mEq/L (ref 19–32)
Calcium: 8.7 mg/dL (ref 8.4–10.5)
Chloride: 103 mEq/L (ref 96–112)
Creatinine, Ser: 0.72 mg/dL (ref 0.40–1.20)
GFR: 85.74 mL/min (ref >60.00)
Glucose, Bld: 96 mg/dL (ref 70–99)
Potassium: 4.4 mEq/L (ref 3.5–5.1)
Sodium: 138 mEq/L (ref 135–145)
Total Bilirubin: 1 mg/dL (ref 0.2–1.2)
Total Protein: 7.2 g/dL (ref 6.0–8.3)

## 2018-10-05 LAB — CBC WITH DIFFERENTIAL/PLATELET
Basophils Absolute: 0.1 10*3/uL (ref 0.0–0.1)
Basophils Relative: 0.7 % (ref 0.0–3.0)
Eosinophils Absolute: 0.1 10*3/uL (ref 0.0–0.7)
Eosinophils Relative: 0.8 % (ref 0.0–5.0)
HCT: 43 % (ref 36.0–46.0)
Hemoglobin: 14.6 g/dL (ref 12.0–15.0)
Lymphocytes Relative: 26.2 % (ref 12.0–46.0)
Lymphs Abs: 2.2 10*3/uL (ref 0.7–4.0)
MCHC: 34.1 g/dL (ref 30.0–36.0)
MCV: 95.7 fl (ref 78.0–100.0)
Monocytes Absolute: 0.5 10*3/uL (ref 0.1–1.0)
Monocytes Relative: 5.7 % (ref 3.0–12.0)
Neutro Abs: 5.5 10*3/uL (ref 1.4–7.7)
Neutrophils Relative %: 66.6 % (ref 43.0–77.0)
Platelets: 293 10*3/uL (ref 150.0–400.0)
RBC: 4.49 Mil/uL (ref 3.87–5.11)
RDW: 12.2 % (ref 11.5–15.5)
WBC: 8.2 10*3/uL (ref 4.0–10.5)

## 2018-10-05 LAB — VITAMIN D 25 HYDROXY (VIT D DEFICIENCY, FRACTURES): VITD: 32.06 ng/mL (ref 30.00–100.00)

## 2018-10-05 LAB — LIPID PANEL
Cholesterol: 234 mg/dL — ABNORMAL HIGH (ref 0–200)
HDL: 77 mg/dL (ref >39.00)
LDL Cholesterol: 125 mg/dL — ABNORMAL HIGH (ref 0–99)
NonHDL: 156.85
Total CHOL/HDL Ratio: 3
Triglycerides: 158 mg/dL — ABNORMAL HIGH (ref 0.0–149.0)
VLDL: 31.6 mg/dL (ref 0.0–40.0)

## 2018-10-05 LAB — TSH: TSH: 3.75 u[IU]/mL (ref 0.35–4.50)

## 2018-10-05 MED ORDER — NEOMYCIN-POLYMYXIN-HC 3.5-10000-1 OT SOLN: 3.0000 [drp] | Freq: Four times a day (QID) | OTIC | 1 refills | Status: DC

## 2018-10-05 NOTE — Progress Notes (Signed)
Krystal Myers is a 48 y.o. female with the following history as recorded in [facility]:  Patient Active Problem List   Diagnosis Date Noted  . Routine general medical examination at a health care facility 01/13/2016  . Chronic eczematous otitis externa of both ears 12/08/2012    Current Outpatient Medications  Medication Sig Dispense Refill  . CAMRESE 0.15-0.03 &0.01 MG tablet Take 1 by mouth daily     No current facility-administered medications for this visit.     Allergies: Patient has no known allergies.  Past Medical History:  Diagnosis Date  . History of chicken pox     Past Surgical History:  Procedure Laterality Date  . BREAST ENHANCEMENT SURGERY      Family History  Problem Relation Age of Onset  . Hypertension Father   . Hyperlipidemia Father   . Hypertension Sister   . Healthy Mother   . Healthy Maternal Grandmother   . Healthy Maternal Grandfather   . Healthy Paternal Grandmother   . Healthy Paternal Grandfather     Social History   Tobacco Use  . Smoking status: Former Smoker    Types: Cigarettes  . Smokeless tobacco: Never Used  . Tobacco comment: Quit over 18 years ago  Substance Use Topics  . Alcohol use: Yes    Alcohol/week: 1.0 standard drinks    Types: 1 Glasses of wine per week    Subjective:  Patient has not been seen in our office since 12/2015; has been having majority of healthcare needs managed by GYN; just wants to get back on track with healthcare needs; GYN did mention in January that needed to change OCPs- recommended that she switch to a progesterone only pill; patient has not started the new progesterone only pill yet;  Also concerned that ears are itching frequently- has been told she has eczema in the past;  Does see dentist and eye doctor regularly; currently wearing Invisalign;  Review of Systems  Constitutional: Positive for malaise/fatigue.  HENT: Positive for ear discharge.   Eyes: Negative.   Respiratory: Negative for  cough and shortness of breath.   Cardiovascular: Negative for chest pain.  Gastrointestinal: Negative for abdominal pain.  Genitourinary: Negative.   Musculoskeletal: Positive for back pain.  Skin: Negative.   Neurological: Negative for dizziness and headaches.  Endo/Heme/Allergies: Negative.   Psychiatric/Behavioral: Negative for depression. The patient is not nervous/anxious and does not have insomnia.        Objective:  Vitals:   10/05/18 0859  BP: (!) 150/92  Pulse: (!) 102  Temp: 98.5 F (36.9 C)  TempSrc: Oral  SpO2: 98%  Weight: 162 lb 12.8 oz (73.8 kg)  Height: 5' 4" (1.626 m)    General: Well developed, well nourished, in no acute distress  Skin : Warm and dry.  Head: Normocephalic and atraumatic  Eyes: Sclera and conjunctiva clear; pupils round and reactive to light; extraocular movements intact  Ears: External normal; canals clear; tympanic membranes normal  Oropharynx: Pink, supple. No suspicious lesions  Neck: Supple without thyromegaly, adenopathy  Lungs: Respirations unlabored; clear to auscultation bilaterally without wheeze, rales, rhonchi  CVS exam: normal rate and regular rhythm.  Abdomen: Soft; nontender; nondistended; normoactive bowel sounds; no masses or hepatosplenomegaly  Musculoskeletal: No deformities; no active joint inflammation  Extremities: No edema, cyanosis, clubbing  Vessels: Symmetric bilaterally  Neurologic: Alert and oriented; speech intact; face symmetrical; moves all extremities well; CNII-XII intact without focal deficit   Assessment:  1. PE (physical exam), annual  2. Lipid screening   3. Other fatigue   4. Chronic left-sided low back pain with left-sided sciatica     Plan:  Age appropriate preventive healthcare needs addressed; encouraged regular eye doctor and dental exams; encouraged regular exercise; will update labs and refills as needed today; follow-up to be determined; She is to d/c OCP and change to progesterone only  pill as previously recommended by GYN; start checking blood pressure regularly and follow-up in 2 months; DASH diet discussed as well; may need to start blood pressure medication; Update lumbar X -ray- follow-up to be determined.  Discussed Cologuard vs colonoscopy- patient wants to consider options and will discuss at next OV.    Return in about 8 weeks (around 11/30/2018).  Orders Placed This Encounter  Procedures  . DG Lumbar Spine 2-3 Views    Standing Status:   Future    Standing Expiration Date:   12/06/2019    Order Specific Question:   Reason for Exam (SYMPTOM  OR DIAGNOSIS REQUIRED)    Answer:   low back pain    Order Specific Question:   Is patient pregnant?    Answer:   No    Order Specific Question:   Preferred imaging location?    Answer:   [facility]    Order Specific Question:   Radiology Contrast Protocol - do NOT remove file path    Answer:   [email or web address]  . CBC w/Diff    Standing Status:   Future    Standing Expiration Date:   10/05/2019  . Comp Met (CMET)    Standing Status:   Future    Standing Expiration Date:   10/05/2019  . Lipid panel    Standing Status:   Future    Standing Expiration Date:   10/05/2019  . TSH    Standing Status:   Future    Standing Expiration Date:   10/05/2019  . Vitamin D (25 hydroxy)    Standing Status:   Future    Standing Expiration Date:   10/05/2019    Requested Prescriptions    No prescriptions requested or ordered in this encounter

## 2018-10-05 NOTE — Patient Instructions (Addendum)
OMRON blood pressure cuff- arm cuff;  Please switch your birth control as we discussed;      DASH Eating Plan DASH stands for "Dietary Approaches to Stop Hypertension." The DASH eating plan is a healthy eating plan that has been shown to reduce high blood pressure (hypertension). It may also reduce your risk for type 2 diabetes, heart disease, and stroke. The DASH eating plan may also help with weight loss. What are tips for following this plan?  General guidelines  Avoid eating more than 2,300 mg (milligrams) of salt (sodium) a day. If you have hypertension, you may need to reduce your sodium intake to 1,500 mg a day.  Limit alcohol intake to no more than 1 drink a day for nonpregnant women and 2 drinks a day for men. One drink equals 12 oz of beer, 5 oz of wine, or 1 oz of hard liquor.  Work with your health care provider to maintain a healthy body weight or to lose weight. Ask what an ideal weight is for you.  Get at least 30 minutes of exercise that causes your heart to beat faster (aerobic exercise) most days of the week. Activities may include walking, swimming, or biking.  Work with your health care provider or diet and nutrition specialist (dietitian) to adjust your eating plan to your individual calorie needs. Reading food labels   Check food labels for the amount of sodium per serving. Choose foods with less than 5 percent of the Daily Value of sodium. Generally, foods with less than 300 mg of sodium per serving fit into this eating plan.  To find whole grains, look for the word "whole" as the first word in the ingredient list. Shopping  Buy products labeled as "low-sodium" or "no salt added."  Buy fresh foods. Avoid canned foods and premade or frozen meals. Cooking  Avoid adding salt when cooking. Use salt-free seasonings or herbs instead of table salt or sea salt. Check with your health care provider or pharmacist before using salt substitutes.  Do not fry foods. Cook  foods using healthy methods such as baking, boiling, grilling, and broiling instead.  Cook with heart-healthy oils, such as olive, canola, soybean, or sunflower oil. Meal planning  Eat a balanced diet that includes: ? 5 or more servings of fruits and vegetables each day. At each meal, try to fill half of your plate with fruits and vegetables. ? Up to 6-8 servings of whole grains each day. ? Less than 6 oz of lean meat, poultry, or fish each day. A 3-oz serving of meat is about the same size as a deck of cards. One egg equals 1 oz. ? 2 servings of low-fat dairy each day. ? A serving of nuts, seeds, or beans 5 times each week. ? Heart-healthy fats. Healthy fats called Omega-3 fatty acids are found in foods such as flaxseeds and coldwater fish, like sardines, salmon, and mackerel.  Limit how much you eat of the following: ? Canned or prepackaged foods. ? Food that is high in trans fat, such as fried foods. ? Food that is high in saturated fat, such as fatty meat. ? Sweets, desserts, sugary drinks, and other foods with added sugar. ? Full-fat dairy products.  Do not salt foods before eating.  Try to eat at least 2 vegetarian meals each week.  Eat more home-cooked food and less restaurant, buffet, and fast food.  When eating at a restaurant, ask that your food be prepared with less salt or no salt,  if possible. What foods are recommended? The items listed may not be a complete list. Talk with your dietitian about what dietary choices are best for you. Grains Whole-grain or whole-wheat bread. Whole-grain or whole-wheat pasta. Brown rice. [place]ena Morrow. Bulgur. Whole-grain and low-sodium cereals. Pita bread. Low-fat, low-sodium crackers. Whole-wheat flour tortillas. Vegetables Fresh or frozen vegetables (raw, steamed, roasted, or grilled). Low-sodium or reduced-sodium tomato and vegetable juice. Low-sodium or reduced-sodium tomato sauce and tomato paste. Low-sodium or reduced-sodium canned  vegetables. Fruits All fresh, dried, or frozen fruit. Canned fruit in natural juice (without added sugar). Meat and other protein foods Skinless chicken or [place]. Ground chicken or [place]. Pork with fat trimmed off. Fish and seafood. Egg whites. Dried beans, peas, or lentils. Unsalted nuts, nut butters, and seeds. Unsalted canned beans. Lean cuts of beef with fat trimmed off. Low-sodium, lean deli meat. Dairy Low-fat (1%) or fat-free (skim) milk. Fat-free, low-fat, or reduced-fat cheeses. Nonfat, low-sodium ricotta or cottage cheese. Low-fat or nonfat yogurt. Low-fat, low-sodium cheese. Fats and oils Soft margarine without trans fats. Vegetable oil. Low-fat, reduced-fat, or light mayonnaise and salad dressings (reduced-sodium). Canola, safflower, olive, soybean, and sunflower oils. Avocado. Seasoning and other foods Herbs. Spices. Seasoning mixes without salt. Unsalted popcorn and pretzels. Fat-free sweets. What foods are not recommended? The items listed may not be a complete list. Talk with your dietitian about what dietary choices are best for you. Grains Baked goods made with fat, such as croissants, muffins, or some breads. Dry pasta or rice meal packs. Vegetables Creamed or fried vegetables. Vegetables in a cheese sauce. Regular canned vegetables (not low-sodium or reduced-sodium). Regular canned tomato sauce and paste (not low-sodium or reduced-sodium). Regular tomato and vegetable juice (not low-sodium or reduced-sodium). Angie Fava. Olives. Fruits Canned fruit in a light or heavy syrup. Fried fruit. Fruit in cream or butter sauce. Meat and other protein foods Fatty cuts of meat. Ribs. Fried meat. Berniece Salines. Sausage. Bologna and other processed lunch meats. Salami. Fatback. Hotdogs. Bratwurst. Salted nuts and seeds. Canned beans with added salt. Canned or smoked fish. Whole eggs or egg yolks. Chicken or [place] with skin. Dairy Whole or 2% milk, cream, and half-and-half. Whole or full-fat  cream cheese. Whole-fat or sweetened yogurt. Full-fat cheese. Nondairy creamers. Whipped toppings. Processed cheese and cheese spreads. Fats and oils Butter. Stick margarine. Lard. Shortening. Ghee. Bacon fat. Tropical oils, such as coconut, palm kernel, or palm oil. Seasoning and other foods Salted popcorn and pretzels. Onion salt, garlic salt, seasoned salt, table salt, and sea salt. Worcestershire sauce. Tartar sauce. Barbecue sauce. Teriyaki sauce. Soy sauce, including reduced-sodium. Steak sauce. Canned and packaged gravies. Fish sauce. Oyster sauce. Cocktail sauce. Horseradish that you find on the shelf. Ketchup. Mustard. Meat flavorings and tenderizers. Bouillon cubes. Hot sauce and Tabasco sauce. Premade or packaged marinades. Premade or packaged taco seasonings. Relishes. Regular salad dressings. Where to find more information:  National Heart, Lung, and [place]: https://wilson-eaton.com/  American Heart Association: www.heart.org Summary  The DASH eating plan is a healthy eating plan that has been shown to reduce high blood pressure (hypertension). It may also reduce your risk for type 2 diabetes, heart disease, and stroke.  With the DASH eating plan, you should limit salt (sodium) intake to 2,300 mg a day. If you have hypertension, you may need to reduce your sodium intake to 1,500 mg a day.  When on the DASH eating plan, aim to eat more fresh fruits and vegetables, whole grains, lean proteins, low-fat dairy, and heart-healthy fats.  Work with your health care provider or diet and nutrition specialist (dietitian) to adjust your eating plan to your individual calorie needs. This information is not intended to replace advice given to you by your health care provider. Make sure you discuss any questions you have with your health care provider. Document Released: 03/05/2011 Document Revised: 02/26/2017 Document Reviewed: 03/09/2016 Elsevier Patient Education  2020 [employer].

## 2018-11-30 ENCOUNTER — Ambulatory Visit (INDEPENDENT_AMBULATORY_CARE_PROVIDER_SITE_OTHER): Payer: 59 | Admitting: Family

## 2018-11-30 ENCOUNTER — Other Ambulatory Visit: Payer: Self-pay

## 2018-11-30 ENCOUNTER — Encounter: Payer: Self-pay | Admitting: Family

## 2018-11-30 ENCOUNTER — Telehealth: Payer: Self-pay

## 2018-11-30 VITALS — BP 140/84 | HR 80 | Temp 98.1°F | Ht 64.0 in | Wt 163.6 lb

## 2018-11-30 DIAGNOSIS — R030 Elevated blood-pressure reading, without diagnosis of hypertension: Secondary | ICD-10-CM

## 2018-11-30 DIAGNOSIS — Z1211 Encounter for screening for malignant neoplasm of colon: Secondary | ICD-10-CM | POA: Diagnosis not present

## 2018-11-30 DIAGNOSIS — N393 Stress incontinence (female) (male): Secondary | ICD-10-CM | POA: Diagnosis not present

## 2018-11-30 DIAGNOSIS — M5432 Sciatica, left side: Secondary | ICD-10-CM

## 2018-11-30 MED ORDER — LOSARTAN POTASSIUM 50 MG PO TABS: 50.0000 mg | ORAL_TABLET | Freq: Every day | ORAL | 0 refills | Status: DC

## 2018-11-30 MED ORDER — METHYLPREDNISOLONE 4 MG PO TBPK: ORAL_TABLET | ORAL | 0 refills | Status: DC

## 2018-11-30 MED ORDER — HYDROCHLOROTHIAZIDE 25 MG PO TABS
25.0000 mg | ORAL_TABLET | Freq: Every day | ORAL | 1 refills | Status: DC
Start: 2018-11-30 — End: 2018-11-30

## 2018-11-30 NOTE — Progress Notes (Signed)
Krystal Myers is a 48 y.o. female with the following history as recorded in EpicCare:  Patient Active Problem List   Diagnosis Date Noted  . Routine general medical examination at a health care facility 01/13/2016  . Chronic eczematous otitis externa of both ears 12/08/2012    Current Outpatient Medications  Medication Sig Dispense Refill  . neomycin-polymyxin-hydrocortisone (CORTISPORIN) OTIC solution Place 3 drops into both ears 4 (four) times daily. 10 mL 1  . norethindrone (MICRONOR) 0.35 MG tablet Take 1 tablet by mouth daily.    Marland Kitchen losartan (COZAAR) 50 MG tablet Take 1 tablet (50 mg total) by mouth daily. 90 tablet 0  . methylPREDNISolone (MEDROL DOSEPAK) 4 MG TBPK tablet Taper as directed 21 tablet 0   No current facility-administered medications for this visit.     Allergies: Patient has no known allergies.  Past Medical History:  Diagnosis Date  . History of chicken pox     Past Surgical History:  Procedure Laterality Date  . BREAST ENHANCEMENT SURGERY      Family History  Problem Relation Age of Onset  . Hypertension Father   . Hyperlipidemia Father   . Hypertension Sister   . Healthy Mother   . Healthy Maternal Grandmother   . Healthy Maternal Grandfather   . Healthy Paternal Grandmother   . Healthy Paternal Grandfather     Social History   Tobacco Use  . Smoking status: Former Smoker    Types: Cigarettes  . Smokeless tobacco: Never Used  . Tobacco comment: Quit over 18 years ago  Substance Use Topics  . Alcohol use: Yes    Alcohol/week: 1.0 standard drinks    Types: 1 Glasses of wine per week    Subjective:  Follow-up on elevated blood pressure; since last OV, has changed to progesterone only birth control; blood pressure has improved but still not normalized; Denies any chest pain, shortness of breath, blurred vision or headache. Notes that she would like to re-start exercising regularly but has problems with urinary urgency/ incontinence; has seen  urology in the past but did not feel that pelvic floor PT was beneficial; knows that exercise will help with weight loss goals; Wants to review X-ray done at last OV; does occasionally have radiating pain down into her left leg; no specific injury or trauma;     Objective:  Vitals:   11/30/18 0855  BP: 140/84  Pulse: 80  Temp: 98.1 F (36.7 C)  TempSrc: Oral  SpO2: 97%  Weight: 163 lb 9.6 oz (74.2 kg)  Height: 5\' 4"  (1.626 m)    General: Well developed, well nourished, in no acute distress  Skin : Warm and dry.  Head: Normocephalic and atraumatic  Lungs: Respirations unlabored; clear to auscultation bilaterally without wheeze, rales, rhonchi  CVS exam: normal rate and regular rhythm.  Musculoskeletal: No deformities; no active joint inflammation  Extremities: No edema, cyanosis, clubbing  Vessels: Symmetric bilaterally  Neurologic: Alert and oriented; speech intact; face symmetrical; moves all extremities well; CNII-XII intact without focal deficit   Assessment:  1. Elevated blood pressure reading   2. Stress incontinence of urine   3. Sciatica of left side   4. Colon cancer screening     Plan:  1. Trial of Losartan 50 mg daily; discussed that would most likely be able to stop medication with weight loss goals; follow-up in 3 months; 2. Refer back to urology; need to find treatments that would allow her to exercise; has not responded to PT and Myrbetriq;  3. Rx for Medrol Dose pak- follow-up if symptoms persist and will refer to sports medicine. 4. Cologuard ordered;  Return in about 3 months (around 03/01/2019).  Orders Placed This Encounter  Procedures  . Ambulatory referral to Urology    Referral Priority:   Routine    Referral Type:   Consultation    Referral Reason:   Specialty Services Required    Requested Specialty:   Urology    Number of Visits Requested:   1    Requested Prescriptions   Signed Prescriptions Disp Refills  . methylPREDNISolone (MEDROL  DOSEPAK) 4 MG TBPK tablet 21 tablet 0    Sig: Taper as directed  . losartan (COZAAR) 50 MG tablet 90 tablet 0    Sig: Take 1 tablet (50 mg total) by mouth daily.

## 2018-11-30 NOTE — Telephone Encounter (Signed)
Ordered today 

## 2018-12-08 ENCOUNTER — Telehealth: Payer: Self-pay

## 2018-12-08 NOTE — Telephone Encounter (Signed)
Appointment scheduled.

## 2018-12-18 LAB — COLOGUARD: Cologuard: NEGATIVE

## 2018-12-20 ENCOUNTER — Encounter: Payer: Self-pay | Admitting: Family

## 2018-12-20 NOTE — Progress Notes (Signed)
Outside notes received. Information abstracted. Notes sent to scan.  

## 2018-12-30 ENCOUNTER — Ambulatory Visit: Payer: 59 | Admitting: Family Medicine

## 2019-01-11 ENCOUNTER — Ambulatory Visit: Payer: 59 | Admitting: Family Medicine

## 2019-02-27 ENCOUNTER — Other Ambulatory Visit: Payer: Self-pay | Admitting: Family

## 2019-03-01 ENCOUNTER — Ambulatory Visit (INDEPENDENT_AMBULATORY_CARE_PROVIDER_SITE_OTHER): Payer: 59 | Admitting: Family

## 2019-03-01 ENCOUNTER — Encounter: Payer: Self-pay | Admitting: Family

## 2019-03-01 VITALS — BP 128/78 | HR 84 | Temp 98.2°F | Ht 64.0 in | Wt 164.8 lb

## 2019-03-01 DIAGNOSIS — I10 Essential (primary) hypertension: Secondary | ICD-10-CM

## 2019-03-01 MED ORDER — LOSARTAN POTASSIUM 50 MG PO TABS: 50.0000 mg | ORAL_TABLET | Freq: Every day | ORAL | 1 refills | Status: DC

## 2019-03-01 NOTE — Progress Notes (Signed)
  Krystal Myers is a 48 y.o. female with the following history as recorded in EpicCare:  Patient Active Problem List   Diagnosis Date Noted  . Routine general medical examination at a health care facility 01/13/2016  . Chronic eczematous otitis externa of both ears 12/08/2012    Current Outpatient Medications  Medication Sig Dispense Refill  . losartan (COZAAR) 50 MG tablet Take 1 tablet (50 mg total) by mouth daily. 90 tablet 1  . neomycin-polymyxin-hydrocortisone (CORTISPORIN) OTIC solution Place 3 drops into both ears 4 (four) times daily. 10 mL 1  . norethindrone (MICRONOR) 0.35 MG tablet Take 1 tablet by mouth daily.     No current facility-administered medications for this visit.     Allergies: Patient has no known allergies.  Past Medical History:  Diagnosis Date  . History of chicken pox     Past Surgical History:  Procedure Laterality Date  . BREAST ENHANCEMENT SURGERY      Family History  Problem Relation Age of Onset  . Hypertension Father   . Hyperlipidemia Father   . Hypertension Sister   . Healthy Mother   . Healthy Maternal Grandmother   . Healthy Maternal Grandfather   . Healthy Paternal Grandmother   . Healthy Paternal Grandfather     Social History   Tobacco Use  . Smoking status: Former Smoker    Types: Cigarettes  . Smokeless tobacco: Never Used  . Tobacco comment: Quit over 18 years ago  Substance Use Topics  . Alcohol use: Yes    Alcohol/week: 1.0 standard drinks    Types: 1 Glasses of wine per week    Subjective:  2 month follow-up on recent start of Losartan; notes she has been feeling much better since starting the medication; increased energy/ fatigue; very excited with response; Denies any chest pain, shortness of breath, blurred vision or headache.  Mammogram and pap smear are scheduled for January 2021;     Objective:  Vitals:   03/01/19 0854  BP: 128/78  Pulse: 84  Temp: 98.2 F (36.8 C)  TempSrc: Oral  SpO2: 99%  Weight:  164 lb 12.8 oz (74.8 kg)  Height: 5\' 4"  (1.626 m)    General: Well developed, well nourished, in no acute distress  Skin : Warm and dry.  Head: Normocephalic and atraumatic  Lungs: Respirations unlabored; clear to auscultation bilaterally without wheeze, rales, rhonchi  CVS exam: normal rate and regular rhythm.  Musculoskeletal: No deformities; no active joint inflammation; ganglion cyst noted on inner left wrist, right dorsal wrist  Extremities: No edema, cyanosis, clubbing  Vessels: Symmetric bilaterally  Neurologic: Alert and oriented; speech intact; face symmetrical; moves all extremities well; CNII-XII intact without focal deficit   Assessment:  1. Essential hypertension     Plan:  Stable; good response to medication; refill updated; follow- up in 6 months.   This visit occurred during the SARS-CoV-2 public health emergency.  Safety protocols were in place, including screening questions prior to the visit, additional usage of staff PPE, and extensive cleaning of exam room while observing appropriate contact time as indicated for disinfecting solutions.    Return in about 6 months (around 08/30/2019).  No orders of the defined types were placed in this encounter.   Requested Prescriptions   Signed Prescriptions Disp Refills  . losartan (COZAAR) 50 MG tablet 90 tablet 1    Sig: Take 1 tablet (50 mg total) by mouth daily.

## 2019-03-01 NOTE — Patient Instructions (Signed)
Ganglion Cyst  A ganglion cyst is a non-cancerous, fluid-filled lump that occurs near a joint or tendon. The cyst grows out of a joint or the lining of a tendon. Ganglion cysts most often develop in the hand or wrist, but they can also develop in the shoulder, elbow, hip, knee, ankle, or foot. Ganglion cysts are ball-shaped or egg-shaped. Their size can range from the size of a pea to larger than a grape. Increased activity may cause the cyst to get bigger because more fluid starts to build up. What are the causes? The exact cause of this condition is not known, but it may be related to:  Inflammation or irritation around the joint.  An injury.  Repetitive movements or overuse.  Arthritis. What increases the risk? You are more likely to develop this condition if:  You are a woman.  You are 40-59 years old. What are the signs or symptoms? The main symptom of this condition is a lump. It most often appears on the hand or wrist. In many cases, there are no other symptoms, but a cyst can sometimes cause:  Tingling.  Pain.  Numbness.  Muscle weakness.  Weak grip.  Less range of motion in a joint. How is this diagnosed? Ganglion cysts are usually diagnosed based on a physical exam. Your health care provider will feel the lump and may shine a light next to it. If it is a ganglion cyst, the light will likely shine through it. Your health care provider may order an X-ray, ultrasound, or MRI to rule out other conditions. How is this treated? Ganglion cysts often go away on their own without treatment. If you have pain or other symptoms, treatment may be needed. Treatment is also needed if the ganglion cyst limits your movement or if it gets infected. Treatment may include:  Wearing a brace or splint on your wrist or finger.  Taking anti-inflammatory medicine.  Having fluid drained from the lump with a needle (aspiration).  Getting a steroid injected into the joint.  Having  surgery to remove the ganglion cyst.  Placing a pad on your shoe or wearing shoes that will not rub against the cyst if it is on your foot. Follow these instructions at home:  Do not press on the ganglion cyst, poke it with a needle, or hit it.  Take over-the-counter and prescription medicines only as told by your health care provider.  If you have a brace or splint: ? Wear it as told by your health care provider. ? Remove it as told by your health care provider. Ask if you need to remove it when you take a shower or a bath.  Watch your ganglion cyst for any changes.  Keep all follow-up visits as told by your health care provider. This is important. Contact a health care provider if:  Your ganglion cyst becomes larger or more painful.  You have pus coming from the lump.  You have weakness or numbness in the affected area.  You have a fever or chills. Get help right away if:  You have a fever and have any of these in the cyst area: ? Increased redness. ? Red streaks. ? Swelling. Summary  A ganglion cyst is a non-cancerous, fluid-filled lump that occurs near a joint or tendon.  Ganglion cysts most often develop in the hand or wrist, but they can also develop in the shoulder, elbow, hip, knee, ankle, or foot.  Ganglion cysts often go away on their own without treatment.  This information is not intended to replace advice given to you by your health care provider. Make sure you discuss any questions you have with your health care provider. Document Released: 03/13/2000 Document Revised: 02/26/2017 Document Reviewed: 11/13/2016 Elsevier Patient Education  2020 [employer].

## 2019-06-23 ENCOUNTER — Telehealth: Payer: Self-pay | Admitting: Family

## 2019-06-23 ENCOUNTER — Other Ambulatory Visit: Payer: Self-pay | Admitting: Family

## 2019-06-23 MED ORDER — LOSARTAN POTASSIUM 50 MG PO TABS: 50.0000 mg | ORAL_TABLET | Freq: Every day | ORAL | 1 refills | Status: DC

## 2019-06-23 NOTE — Telephone Encounter (Signed)
    1.Medication Requested:losartan (COZAAR) 50 MG tablet  2. Pharmacy (Name, Street, City):[id number] - [place], [place] - [address]  3. On Med List: yes  4. Last Visit with PCP: 03/01/19  5. Next visit date with PCP: 08/30/19   Agent: Please be advised that RX refills may take up to 3 business days. We ask that you follow-up with your pharmacy.

## 2019-08-30 ENCOUNTER — Ambulatory Visit (INDEPENDENT_AMBULATORY_CARE_PROVIDER_SITE_OTHER): Payer: 59 | Admitting: Family

## 2019-08-30 ENCOUNTER — Encounter: Payer: Self-pay | Admitting: Family

## 2019-08-30 ENCOUNTER — Other Ambulatory Visit: Payer: Self-pay

## 2019-08-30 VITALS — BP 124/78 | HR 86 | Temp 98.2°F | Ht 64.0 in | Wt 160.6 lb

## 2019-08-30 DIAGNOSIS — I10 Essential (primary) hypertension: Secondary | ICD-10-CM | POA: Diagnosis not present

## 2019-08-30 DIAGNOSIS — M778 Other enthesopathies, not elsewhere classified: Secondary | ICD-10-CM

## 2019-08-30 DIAGNOSIS — R2232 Localized swelling, mass and lump, left upper limb: Secondary | ICD-10-CM

## 2019-08-30 DIAGNOSIS — Z13220 Encounter for screening for lipoid disorders: Secondary | ICD-10-CM | POA: Diagnosis not present

## 2019-08-30 DIAGNOSIS — R5383 Other fatigue: Secondary | ICD-10-CM

## 2019-08-30 MED ORDER — LOSARTAN POTASSIUM 50 MG PO TABS: 50.0000 mg | ORAL_TABLET | Freq: Every day | ORAL | 3 refills | Status: DC

## 2019-08-30 MED ORDER — MELOXICAM 15 MG PO TABS: 15.0000 mg | ORAL_TABLET | Freq: Every day | ORAL | 0 refills | Status: DC

## 2019-08-30 MED ORDER — MELOXICAM 15 MG PO TABS
15.0000 mg | ORAL_TABLET | Freq: Every day | ORAL | 0 refills | Status: DC
Start: 2019-08-30 — End: 2019-08-30

## 2019-08-30 MED ORDER — NEOMYCIN-POLYMYXIN-HC 3.5-10000-1 OT SOLN: 3.0000 [drp] | Freq: Four times a day (QID) | OTIC | 1 refills | Status: DC

## 2019-08-30 NOTE — Progress Notes (Signed)
Krystal Myers is a 49 y.o. female with the following history as recorded in EpicCare:  Patient Active Problem List   Diagnosis Date Noted  . Routine general medical examination at a health care facility 01/13/2016  . Chronic eczematous otitis externa of both ears 12/08/2012    Current Outpatient Medications  Medication Sig Dispense Refill  . losartan (COZAAR) 50 MG tablet Take 1 tablet (50 mg total) by mouth daily. 90 tablet 3  . neomycin-polymyxin-hydrocortisone (CORTISPORIN) OTIC solution Place 3 drops into both ears 4 (four) times daily. 10 mL 1  . norethindrone (MICRONOR) 0.35 MG tablet Take 1 tablet by mouth daily.    . meloxicam (MOBIC) 15 MG tablet Take 1 tablet (15 mg total) by mouth daily. 30 tablet 0   No current facility-administered medications for this visit.    Allergies: Patient has no known allergies.  Past Medical History:  Diagnosis Date  . History of chicken pox     Past Surgical History:  Procedure Laterality Date  . BREAST ENHANCEMENT SURGERY      Family History  Problem Relation Age of Onset  . Hypertension Father   . Hyperlipidemia Father   . Hypertension Sister   . Healthy Mother   . Healthy Maternal Grandmother   . Healthy Maternal Grandfather   . Healthy Paternal Grandmother   . Healthy Paternal Grandfather     Social History   Tobacco Use  . Smoking status: Former Smoker    Types: Cigarettes  . Smokeless tobacco: Never Used  . Tobacco comment: Quit over 18 years ago  Substance Use Topics  . Alcohol use: Yes    Alcohol/week: 1.0 standard drinks    Types: 1 Glasses of wine per week    Subjective:  6 month follow up on hypertension; doing well on Losartan 50 mg daily; does check blood pressure and numbers are comparable to what is seen today; Denies any chest pain, shortness of breath, blurred vision or headache.   Objective:  Vitals:   08/30/19 0906  BP: 124/78  Pulse: 86  Temp: 98.2 F (36.8 C)  TempSrc: Oral  SpO2: 99%   Weight: 160 lb 9.6 oz (72.8 kg)  Height: 5' 4"  (1.626 m)    General: Well developed, well nourished, in no acute distress  Skin : Warm and dry.  Head: Normocephalic and atraumatic  Lungs: Respirations unlabored; clear to auscultation bilaterally without wheeze, rales, rhonchi  CVS exam: normal rate and regular rhythm.  Neurologic: Alert and oriented; speech intact; face symmetrical; moves all extremities well; CNII-XII intact without focal deficit   Assessment:  1. Essential hypertension   2. Lipid screening   3. Other fatigue   4. Nodule of finger of left hand   5. Right elbow tendonitis     Plan:  1. Stable; continue same medications; 2. Lipid screening updated; 3. Plan to get labs done at [facility] at her convenience; follow-up to be determined; may need to update sleep study; 4. Refer to orthopedist; 5. Rx for Mobic 15 mg daily; if no relief, to consider sports medicine;  This visit occurred during the SARS-CoV-2 public health emergency.  Safety protocols were in place, including screening questions prior to the visit, additional usage of staff PPE, and extensive cleaning of exam room while observing appropriate contact time as indicated for disinfecting solutions.     No follow-ups on file.  Orders Placed This Encounter  Procedures  . CBC with Differential/Platelet    Standing Status:   Future  Standing Expiration Date:   08/29/2020  . Comp Met (CMET)    Standing Status:   Future    Standing Expiration Date:   08/29/2020  . Lipid panel    Standing Status:   Future    Standing Expiration Date:   08/29/2020  . TSH    Standing Status:   Future    Standing Expiration Date:   08/29/2020  . Vitamin D (25 hydroxy)    Standing Status:   Future    Standing Expiration Date:   08/29/2020  . B12    Standing Status:   Future    Standing Expiration Date:   08/29/2020  . Ambulatory referral to Orthopedic Surgery    Referral Priority:   Routine    Referral Type:   Surgical    Referral  Reason:   Specialty Services Required    Requested Specialty:   Orthopedic Surgery    Number of Visits Requested:   1    Requested Prescriptions   Signed Prescriptions Disp Refills  . losartan (COZAAR) 50 MG tablet 90 tablet 3    Sig: Take 1 tablet (50 mg total) by mouth daily.  . meloxicam (MOBIC) 15 MG tablet 30 tablet 0    Sig: Take 1 tablet (15 mg total) by mouth daily.  Marland Kitchen neomycin-polymyxin-hydrocortisone (CORTISPORIN) OTIC solution 10 mL 1    Sig: Place 3 drops into both ears 4 (four) times daily.

## 2019-09-15 ENCOUNTER — Other Ambulatory Visit (INDEPENDENT_AMBULATORY_CARE_PROVIDER_SITE_OTHER): Payer: 59

## 2019-09-15 DIAGNOSIS — R5383 Other fatigue: Secondary | ICD-10-CM

## 2019-09-15 DIAGNOSIS — Z13220 Encounter for screening for lipoid disorders: Secondary | ICD-10-CM | POA: Diagnosis not present

## 2019-09-15 DIAGNOSIS — I10 Essential (primary) hypertension: Secondary | ICD-10-CM | POA: Diagnosis not present

## 2019-09-15 LAB — COMPREHENSIVE METABOLIC PANEL
ALT: 12 U/L (ref 0–35)
AST: 17 U/L (ref 0–37)
Albumin: 4.5 g/dL (ref 3.5–5.2)
Alkaline Phosphatase: 58 U/L (ref 39–117)
BUN: 25 mg/dL — ABNORMAL HIGH (ref 6–23)
CO2: 26 mEq/L (ref 19–32)
Calcium: 9.3 mg/dL (ref 8.4–10.5)
Chloride: 107 mEq/L (ref 96–112)
Creatinine, Ser: 0.79 mg/dL (ref 0.40–1.20)
GFR: 76.74 mL/min (ref >60.00)
Glucose, Bld: 100 mg/dL — ABNORMAL HIGH (ref 70–99)
Potassium: 4 mEq/L (ref 3.5–5.1)
Sodium: 139 mEq/L (ref 135–145)
Total Bilirubin: 1.3 mg/dL — ABNORMAL HIGH (ref 0.2–1.2)
Total Protein: 7.3 g/dL (ref 6.0–8.3)

## 2019-09-15 LAB — CBC WITH DIFFERENTIAL/PLATELET
Basophils Absolute: 0 10*3/uL (ref 0.0–0.1)
Basophils Relative: 0.6 % (ref 0.0–3.0)
Eosinophils Absolute: 0.1 10*3/uL (ref 0.0–0.7)
Eosinophils Relative: 1.4 % (ref 0.0–5.0)
HCT: 40.2 % (ref 36.0–46.0)
Hemoglobin: 13.8 g/dL (ref 12.0–15.0)
Lymphocytes Relative: 26.2 % (ref 12.0–46.0)
Lymphs Abs: 2 10*3/uL (ref 0.7–4.0)
MCHC: 34.4 g/dL (ref 30.0–36.0)
MCV: 95.3 fl (ref 78.0–100.0)
Monocytes Absolute: 0.8 10*3/uL (ref 0.1–1.0)
Monocytes Relative: 9.8 % (ref 3.0–12.0)
Neutro Abs: 4.8 10*3/uL (ref 1.4–7.7)
Neutrophils Relative %: 62 % (ref 43.0–77.0)
Platelets: 273 10*3/uL (ref 150.0–400.0)
RBC: 4.22 Mil/uL (ref 3.87–5.11)
RDW: 12.3 % (ref 11.5–15.5)
WBC: 7.7 10*3/uL (ref 4.0–10.5)

## 2019-09-15 LAB — TSH: TSH: 6.22 u[IU]/mL — ABNORMAL HIGH (ref 0.35–4.50)

## 2019-09-15 LAB — VITAMIN B12: Vitamin B-12: 301 pg/mL (ref 211–911)

## 2019-09-15 LAB — LIPID PANEL
Cholesterol: 192 mg/dL (ref 0–200)
HDL: 80.8 mg/dL (ref >39.00)
LDL Cholesterol: 103 mg/dL — ABNORMAL HIGH (ref 0–99)
NonHDL: 111.48
Total CHOL/HDL Ratio: 2
Triglycerides: 40 mg/dL (ref 0.0–149.0)
VLDL: 8 mg/dL (ref 0.0–40.0)

## 2019-09-15 LAB — VITAMIN D 25 HYDROXY (VIT D DEFICIENCY, FRACTURES): VITD: 64.05 ng/mL (ref 30.00–100.00)

## 2019-09-18 ENCOUNTER — Other Ambulatory Visit: Payer: Self-pay | Admitting: Family

## 2019-09-18 DIAGNOSIS — E039 Hypothyroidism, unspecified: Secondary | ICD-10-CM

## 2019-09-19 ENCOUNTER — Ambulatory Visit: Payer: Self-pay

## 2019-09-19 ENCOUNTER — Ambulatory Visit (INDEPENDENT_AMBULATORY_CARE_PROVIDER_SITE_OTHER): Payer: 59 | Admitting: Orthopaedic Surgery

## 2019-09-19 DIAGNOSIS — M151 Heberden's nodes (with arthropathy): Secondary | ICD-10-CM | POA: Diagnosis not present

## 2019-09-19 DIAGNOSIS — M67432 Ganglion, left wrist: Secondary | ICD-10-CM

## 2019-09-19 DIAGNOSIS — M7711 Lateral epicondylitis, right elbow: Secondary | ICD-10-CM | POA: Diagnosis not present

## 2019-09-19 NOTE — Progress Notes (Signed)
Office Visit Note   Patient: Krystal Myers           Date of Birth: 01/13/1971           MRN: [id number] Visit Date: 09/19/2019              Requested by: Marrian Salvage, [place],  [place] [id number] PCP: Marrian Salvage, FNP   Assessment & Plan: Visit Diagnoses:  1. 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 Right tennis elbow   3. Ganglion cyst of volar aspect of left wrist     Plan: Impression is left long finger DIP osteophyte and OA, left volar wrist ganglion cyst, right lateral epicondylitis.  Based on our discussion of treatment options for the various issues she will continue to treat this with topical and oral NSAIDs and relative rest.  We gave her exercises for the tennis elbow.  We will see her back as needed.  Follow-Up Instructions: Return if symptoms worsen or fail to improve.   Orders:  Orders Placed This Encounter  Procedures   XR Finger Middle Left   No orders of the defined types were placed in this encounter.     Procedures: No procedures performed   Clinical Data: No additional findings.   Subjective: Chief Complaint  Patient presents with   Right Elbow - Pain    Patient is a very pleasant 49 year old female who works at [employer] who comes in for evaluation of a painful knot on the dorsal aspect of the left long finger DIP joint as well as a left volar wrist ganglion cyst and the right elbow pain.  The right elbow pain started after she did a lot of yard work back in the spring.  She has been using Biofreeze spray Voltaren gel.  Rest does significantly help with the pain.  The finger is symptomatic when she bumps it on something.   Review of Systems  Constitutional: Negative.   HENT: Negative.   Eyes: Negative.   Respiratory: Negative.   Cardiovascular: Negative.   Endocrine: Negative.   Musculoskeletal: Negative.   Neurological: Negative.     Hematological: Negative.   Psychiatric/Behavioral: Negative.   All other systems reviewed and are negative.    Objective: Vital Signs: There were no vitals taken for this visit.  Physical Exam Vitals and nursing note reviewed.  Constitutional:      Appearance: She is well-developed.  HENT:     Head: Normocephalic and atraumatic.  Pulmonary:     Effort: Pulmonary effort is normal.  Abdominal:     Palpations: Abdomen is soft.  Musculoskeletal:     Cervical back: Neck supple.  Skin:    General: Skin is warm.     Capillary Refill: Capillary refill takes less than 2 seconds.  Neurological:     Mental Status: She is alert and oriented to person, place, and time.  Psychiatric:        Behavior: Behavior normal.        Thought Content: Thought content normal.        Judgment: Judgment normal.     Ortho Exam Left long finger shows a painful nodule on the radial aspect of the DIP joint consistent with a dorsal osteophyte.  There are no mucous cysts.  No nail changes.  Good range of motion overall. Left wrist shows a small palpable volar ganglion cyst without any aggressive features. Right elbow shows slight tenderness  to the common extensor tendon.  Radial tunnel nontender.  No significant pain with resisted wrist extension or ECRB extension. Specialty Comments:  No specialty comments available.  Imaging: XR Finger Middle Left  Result Date: 09/19/2019 Small dorsal osteophyte of the DIP joint    PMFS History: Patient Active Problem List   Diagnosis Date Noted   Osteoarthritis of distal interphalangeal (DIP) joint of left middle finger 09/19/2019   Routine general medical examination at a health care facility 01/13/2016   Chronic eczematous otitis externa of both ears 12/08/2012   Past Medical History:  Diagnosis Date   History of chicken pox     Family History  Problem Relation Age of Onset   Hypertension Father    Hyperlipidemia Father    Hypertension  Sister    Healthy Mother    Healthy Maternal Grandmother    Healthy Maternal Grandfather    Healthy Paternal Grandmother    Healthy Paternal Grandfather     Past Surgical History:  Procedure Laterality Date   BREAST ENHANCEMENT SURGERY     Social History   Occupational History   Occupation: Librarian, academic  Tobacco Use   Smoking status: Former Smoker    Types: Cigarettes   Smokeless tobacco: Never Used   Tobacco comment: Quit over 18 years ago  Substance and Sexual Activity   Alcohol use: Yes    Alcohol/week: 1.0 standard drink    Types: 1 Glasses of wine per week   Drug use: No   Sexual activity: Yes    Birth control/protection: Pill

## 2019-10-09 ENCOUNTER — Other Ambulatory Visit (INDEPENDENT_AMBULATORY_CARE_PROVIDER_SITE_OTHER): Payer: 59

## 2019-10-09 DIAGNOSIS — E039 Hypothyroidism, unspecified: Secondary | ICD-10-CM | POA: Diagnosis not present

## 2019-10-09 LAB — THYROID PANEL WITH TSH
Free Thyroxine Index: 1.8 (ref 1.4–3.8)
T3 Uptake: 29 % (ref 22–35)
T4, Total: 6.1 ug/dL (ref 5.1–11.9)
TSH: 9.44 mIU/L — ABNORMAL HIGH

## 2019-10-09 LAB — COMPREHENSIVE METABOLIC PANEL
ALT: 11 U/L (ref 0–35)
AST: 14 U/L (ref 0–37)
Albumin: 4.1 g/dL (ref 3.5–5.2)
Alkaline Phosphatase: 50 U/L (ref 39–117)
BUN: 20 mg/dL (ref 6–23)
CO2: 26 mEq/L (ref 19–32)
Calcium: 8.8 mg/dL (ref 8.4–10.5)
Chloride: 107 mEq/L (ref 96–112)
Creatinine, Ser: 0.67 mg/dL (ref 0.40–1.20)
GFR: 92.79 mL/min (ref >60.00)
Glucose, Bld: 101 mg/dL — ABNORMAL HIGH (ref 70–99)
Potassium: 4.1 mEq/L (ref 3.5–5.1)
Sodium: 139 mEq/L (ref 135–145)
Total Bilirubin: 1.3 mg/dL — ABNORMAL HIGH (ref 0.2–1.2)
Total Protein: 6.6 g/dL (ref 6.0–8.3)

## 2019-10-10 ENCOUNTER — Other Ambulatory Visit: Payer: Self-pay | Admitting: Family

## 2019-10-10 MED ORDER — LEVOTHYROXINE SODIUM 50 MCG PO TABS
50.0000 ug | ORAL_TABLET | Freq: Every day | ORAL | 1 refills | Status: DC
Start: 2019-10-10 — End: 2020-01-01

## 2019-11-22 ENCOUNTER — Other Ambulatory Visit: Payer: Self-pay | Admitting: Family

## 2019-11-22 ENCOUNTER — Encounter: Payer: Self-pay | Admitting: Family

## 2019-11-22 DIAGNOSIS — E039 Hypothyroidism, unspecified: Secondary | ICD-10-CM

## 2019-12-08 ENCOUNTER — Other Ambulatory Visit: Payer: 59

## 2019-12-08 DIAGNOSIS — E039 Hypothyroidism, unspecified: Secondary | ICD-10-CM

## 2019-12-09 LAB — THYROID PANEL WITH TSH
Free Thyroxine Index: 1.7 (ref 1.4–3.8)
T3 Uptake: 30 % (ref 22–35)
T4, Total: 5.8 ug/dL (ref 5.1–11.9)
TSH: 11.83 mIU/L — ABNORMAL HIGH

## 2019-12-11 ENCOUNTER — Other Ambulatory Visit: Payer: Self-pay | Admitting: Family

## 2019-12-11 DIAGNOSIS — E039 Hypothyroidism, unspecified: Secondary | ICD-10-CM

## 2019-12-22 ENCOUNTER — Ambulatory Visit
Admission: RE | Admit: 2019-12-22 | Discharge: 2019-12-22 | Disposition: A | Discharge status: Home / Self Care | Payer: 59 | Source: Ambulatory Visit | Attending: Family | Admitting: Family

## 2019-12-22 DIAGNOSIS — E039 Hypothyroidism, unspecified: Secondary | ICD-10-CM

## 2019-12-25 ENCOUNTER — Encounter: Payer: Self-pay | Admitting: Family

## 2019-12-27 ENCOUNTER — Telehealth: Payer: Self-pay

## 2019-12-28 NOTE — Telephone Encounter (Signed)
Faxed today

## 2020-01-01 ENCOUNTER — Other Ambulatory Visit: Payer: Self-pay | Admitting: Family

## 2020-01-01 ENCOUNTER — Encounter: Payer: Self-pay | Admitting: Family

## 2020-01-01 MED ORDER — LEVOTHYROXINE SODIUM 50 MCG PO TABS: 50.0000 ug | ORAL_TABLET | Freq: Every day | ORAL | 0 refills | Status: DC

## 2020-03-30 ENCOUNTER — Other Ambulatory Visit: Payer: Self-pay | Admitting: Family

## 2020-04-22 ENCOUNTER — Other Ambulatory Visit: Payer: Self-pay | Admitting: Family

## 2020-04-23 ENCOUNTER — Encounter: Payer: Self-pay | Admitting: Family

## 2020-04-24 ENCOUNTER — Other Ambulatory Visit: Payer: Self-pay | Admitting: Family

## 2020-04-24 DIAGNOSIS — E039 Hypothyroidism, unspecified: Secondary | ICD-10-CM

## 2020-04-26 ENCOUNTER — Other Ambulatory Visit: Payer: Self-pay | Admitting: Family

## 2020-04-26 ENCOUNTER — Other Ambulatory Visit (INDEPENDENT_AMBULATORY_CARE_PROVIDER_SITE_OTHER): Payer: 59

## 2020-04-26 DIAGNOSIS — E039 Hypothyroidism, unspecified: Secondary | ICD-10-CM

## 2020-04-26 LAB — TSH: TSH: 2.6 u[IU]/mL (ref 0.35–4.50)

## 2020-04-26 MED ORDER — LEVOTHYROXINE SODIUM 50 MCG PO TABS: 50.0000 ug | ORAL_TABLET | Freq: Every day | ORAL | 1 refills | Status: DC

## 2020-07-08 LAB — HM MAMMOGRAPHY

## 2020-08-26 ENCOUNTER — Other Ambulatory Visit: Payer: Self-pay | Admitting: Family

## 2020-08-27 ENCOUNTER — Telehealth: Payer: Self-pay | Admitting: Family

## 2020-08-27 NOTE — Telephone Encounter (Signed)
Please let her know that she is due for OV; she is welcome to follow me here or I can get her established with Dr. Quay Burow at [facility]; what does she prefer?

## 2020-08-28 IMAGING — DX LUMBAR SPINE - 2-3 VIEW
3 series · 3 of 3 positions shown · non-contrast
Comparison: None

CLINICAL DATA: Lower back pain radiating down LEFT leg for 3 weeks,
no known injury

EXAM:
LUMBAR SPINE - 2-3 VIEW

[l-spine ap]
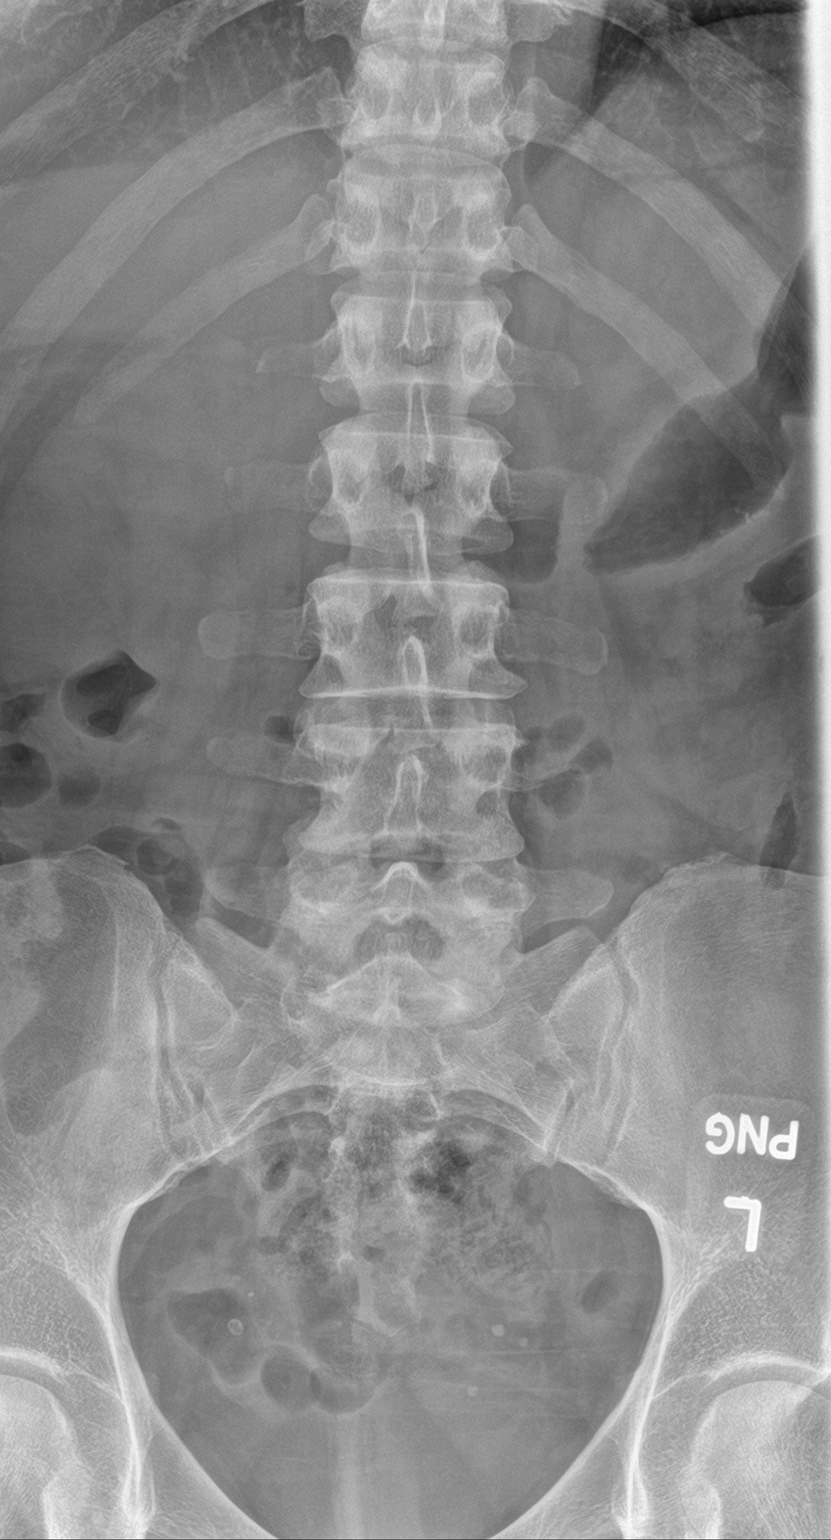

[l-spine lat]
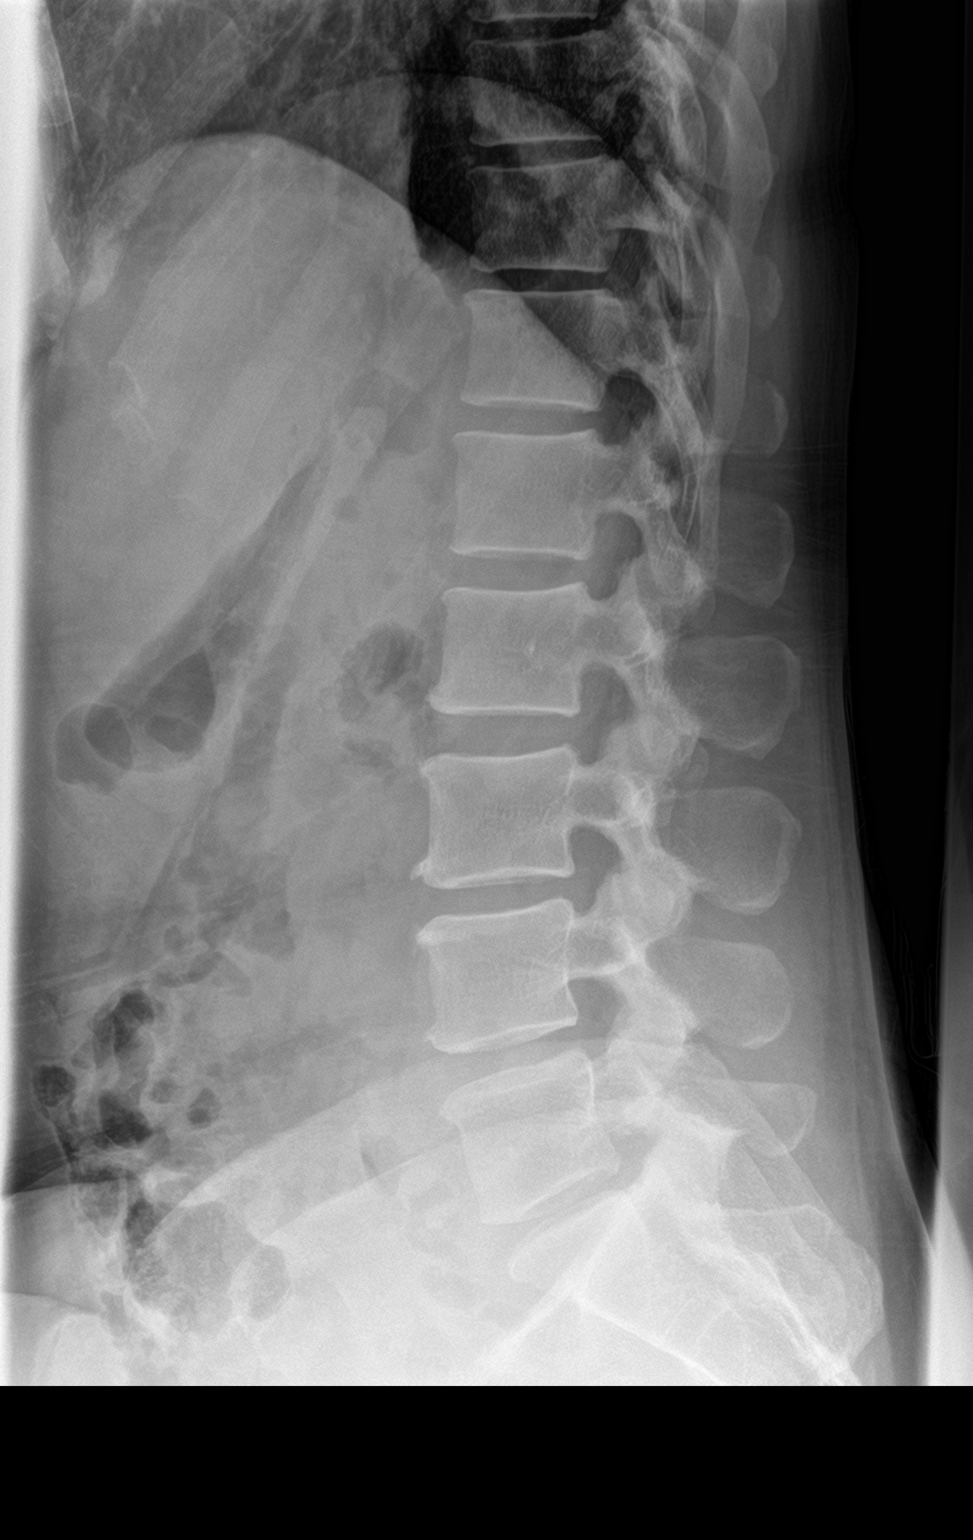

[l-spine spot]
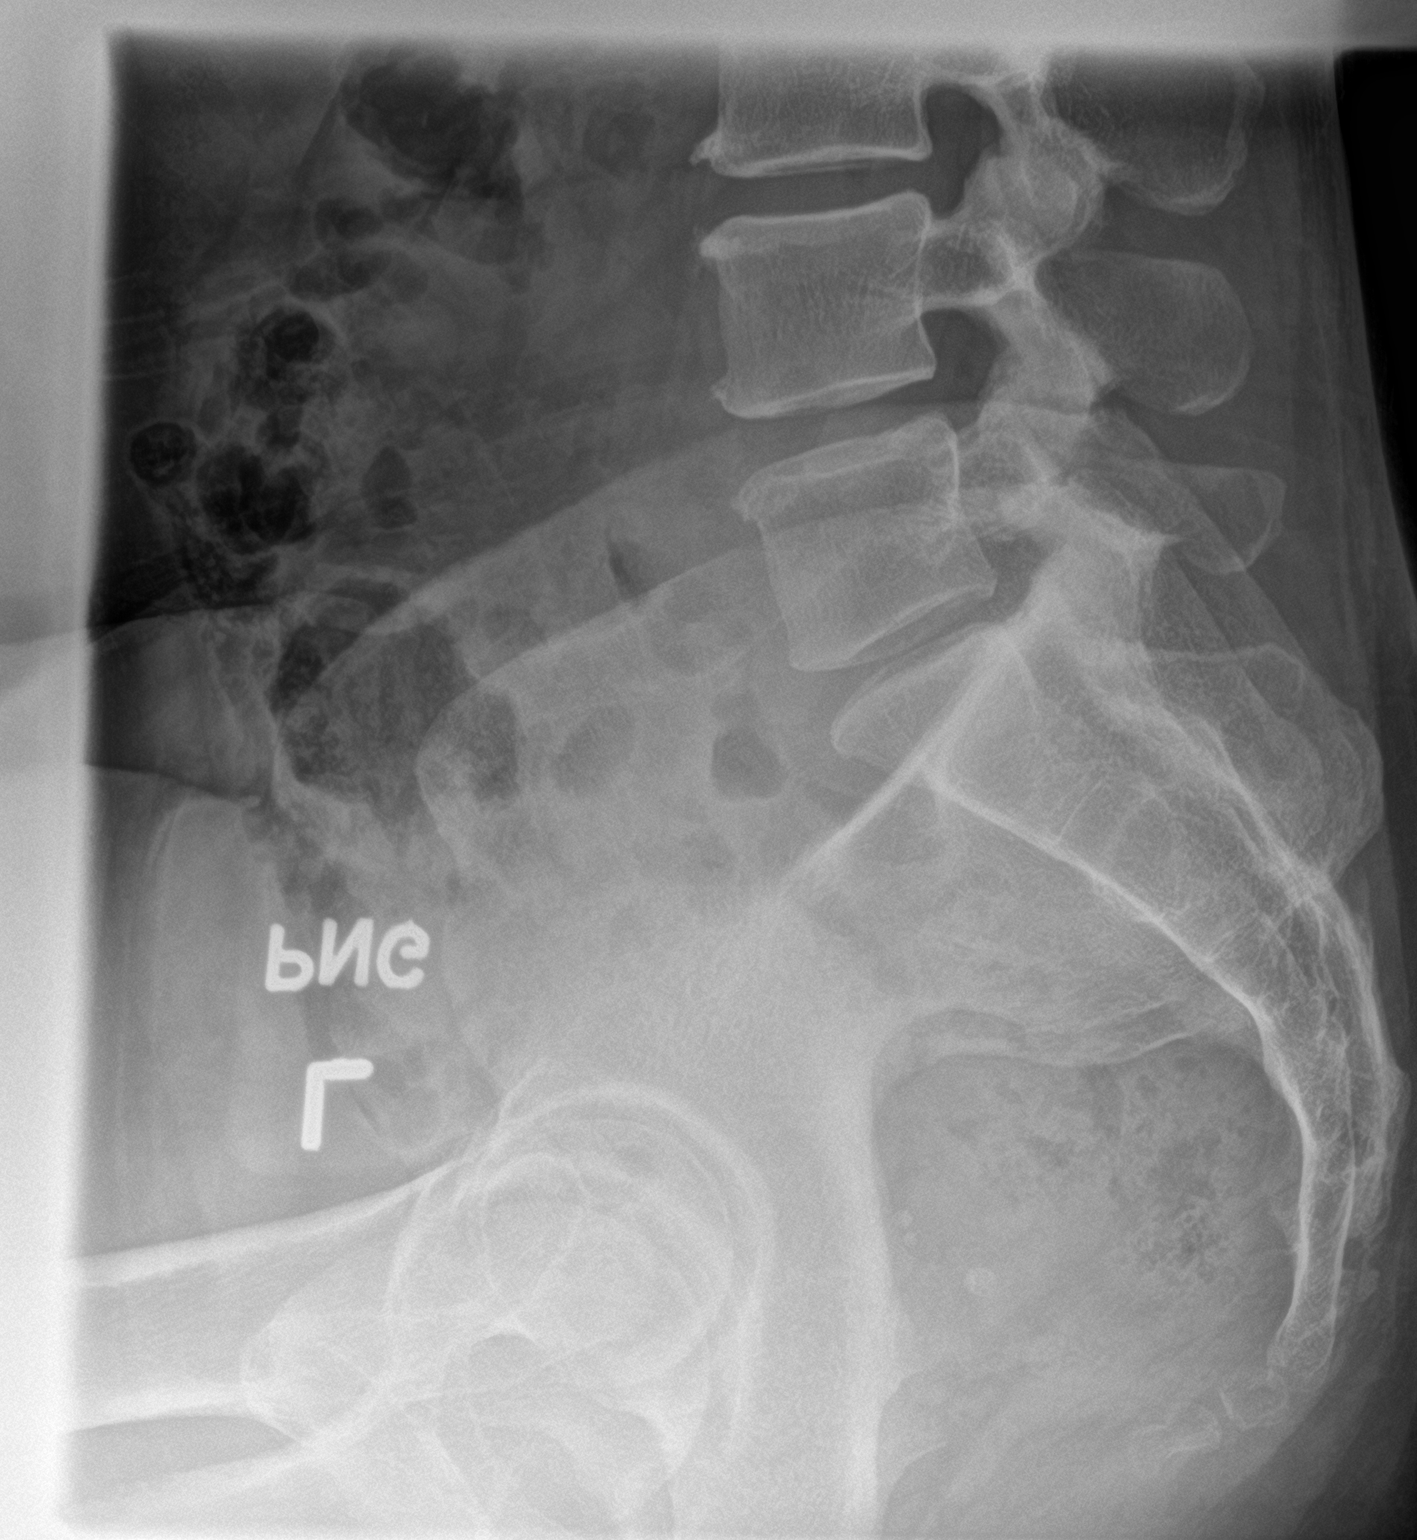

[3 of 3 positions shown; findings below may reference images not displayed]

FINDINGS: Five non-rib bearing lumbar vertebrae.

Osseous mineralization normal.

Vertebral body and disc space heights maintained.

Tiny endplate spurs at L3-L4 and L4-L5.

No fracture, subluxation, or bone destruction.

SI joints preserved.
IMPRESSION: Minimal degenerative disc disease changes.

No acute abnormalities.

## 2020-08-28 NOTE — Telephone Encounter (Signed)
I have called pt and there was no answer so I left a message to call back. Does she plan on staying with Mickel Baas and transferring to [facility] or does she want to stay at the [facility] office.

## 2020-08-28 NOTE — Telephone Encounter (Signed)
I have attempted to contact pt to see what her plan was for long term care. There was no answer so I left a VM with the message below and to give [place] a call back with the answer.

## 2020-08-30 NOTE — Telephone Encounter (Signed)
I have attempted to reach the pt with no success

## 2020-09-13 ENCOUNTER — Encounter: Payer: Self-pay | Admitting: Family

## 2020-09-13 ENCOUNTER — Ambulatory Visit (INDEPENDENT_AMBULATORY_CARE_PROVIDER_SITE_OTHER): Payer: 59 | Admitting: Family

## 2020-09-13 ENCOUNTER — Other Ambulatory Visit: Payer: Self-pay

## 2020-09-13 VITALS — BP 122/60 | HR 63 | Temp 98.1°F | Ht 64.0 in | Wt 148.0 lb

## 2020-09-13 DIAGNOSIS — I10 Essential (primary) hypertension: Secondary | ICD-10-CM | POA: Diagnosis not present

## 2020-09-13 DIAGNOSIS — E039 Hypothyroidism, unspecified: Secondary | ICD-10-CM | POA: Diagnosis not present

## 2020-09-13 DIAGNOSIS — Z13220 Encounter for screening for lipoid disorders: Secondary | ICD-10-CM

## 2020-09-13 LAB — COMPREHENSIVE METABOLIC PANEL
ALT: 12 U/L (ref 0–35)
AST: 17 U/L (ref 0–37)
Albumin: 4.3 g/dL (ref 3.5–5.2)
Alkaline Phosphatase: 55 U/L (ref 39–117)
BUN: 19 mg/dL (ref 6–23)
CO2: 30 mEq/L (ref 19–32)
Calcium: 9.5 mg/dL (ref 8.4–10.5)
Chloride: 104 mEq/L (ref 96–112)
Creatinine, Ser: 0.71 mg/dL (ref 0.40–1.20)
GFR: 98.09 mL/min (ref >60.00)
Glucose, Bld: 93 mg/dL (ref 70–99)
Potassium: 4.7 mEq/L (ref 3.5–5.1)
Sodium: 140 mEq/L (ref 135–145)
Total Bilirubin: 1.3 mg/dL — ABNORMAL HIGH (ref 0.2–1.2)
Total Protein: 6.7 g/dL (ref 6.0–8.3)

## 2020-09-13 LAB — CBC WITH DIFFERENTIAL/PLATELET
Basophils Absolute: 0 10*3/uL (ref 0.0–0.1)
Basophils Relative: 0.6 % (ref 0.0–3.0)
Eosinophils Absolute: 0.1 10*3/uL (ref 0.0–0.7)
Eosinophils Relative: 1.4 % (ref 0.0–5.0)
HCT: 37.9 % (ref 36.0–46.0)
Hemoglobin: 12.9 g/dL (ref 12.0–15.0)
Lymphocytes Relative: 44.3 % (ref 12.0–46.0)
Lymphs Abs: 2.2 10*3/uL (ref 0.7–4.0)
MCHC: 33.9 g/dL (ref 30.0–36.0)
MCV: 93 fl (ref 78.0–100.0)
Monocytes Absolute: 0.5 10*3/uL (ref 0.1–1.0)
Monocytes Relative: 9.4 % (ref 3.0–12.0)
Neutro Abs: 2.2 10*3/uL (ref 1.4–7.7)
Neutrophils Relative %: 44.3 % (ref 43.0–77.0)
Platelets: 257 10*3/uL (ref 150.0–400.0)
RBC: 4.07 Mil/uL (ref 3.87–5.11)
RDW: 12.4 % (ref 11.5–15.5)
WBC: 5.1 10*3/uL (ref 4.0–10.5)

## 2020-09-13 LAB — LIPID PANEL
Cholesterol: 212 mg/dL — ABNORMAL HIGH (ref 0–200)
HDL: 91 mg/dL (ref >39.00)
LDL Cholesterol: 110 mg/dL — ABNORMAL HIGH (ref 0–99)
NonHDL: 120.67
Total CHOL/HDL Ratio: 2
Triglycerides: 55 mg/dL (ref 0.0–149.0)
VLDL: 11 mg/dL (ref 0.0–40.0)

## 2020-09-13 LAB — TSH: TSH: 4.74 u[IU]/mL — ABNORMAL HIGH (ref 0.35–4.50)

## 2020-09-13 MED ORDER — LOSARTAN POTASSIUM 25 MG PO TABS: 25.0000 mg | ORAL_TABLET | Freq: Every day | ORAL | 1 refills | Status: DC

## 2020-09-13 NOTE — Progress Notes (Signed)
Elana Jian is a 50 y.o. female with the following history as recorded in EpicCare:  Patient Active Problem List   Diagnosis Date Noted   Osteoarthritis of distal interphalangeal (DIP) joint of left middle finger 09/19/2019   Routine general medical examination at a health care facility 01/13/2016   Chronic eczematous otitis externa of both ears 12/08/2012    Current Outpatient Medications  Medication Sig Dispense Refill   levothyroxine (SYNTHROID) 50 MCG tablet Take 1 tablet (50 mcg total) by mouth daily. 90 tablet 1   losartan (COZAAR) 25 MG tablet Take 1 tablet (25 mg total) by mouth daily. 90 tablet 1   neomycin-polymyxin-hydrocortisone (CORTISPORIN) OTIC solution Place 3 drops into both ears 4 (four) times daily. 10 mL 1   oxybutynin (DITROPAN-XL) 10 MG 24 hr tablet oxybutynin chloride ER 10 mg tablet,extended release 24 hr     No current facility-administered medications for this visit.    Allergies: Latex  Past Medical History:  Diagnosis Date   History of chicken pox     Past Surgical History:  Procedure Laterality Date   BREAST ENHANCEMENT SURGERY      Family History  Problem Relation Age of Onset   Hypertension Father    Hyperlipidemia Father    Hypertension Sister    Healthy Mother    Healthy Maternal Grandmother    Healthy Maternal Grandfather    Healthy Paternal Grandmother    Healthy Paternal Grandfather     Social History   Tobacco Use   Smoking status: Former    Pack years: 0.00    Types: Cigarettes   Smokeless tobacco: Never   Tobacco comments:    Quit over 18 years ago  Substance Use Topics   Alcohol use: Yes    Alcohol/week: 1.0 standard drink    Types: 1 Glasses of wine per week    Subjective:  1 year follow up on hypertension and hypothyroidism; no acute concerns today; has lost 12 pounds since last OV in 2021; actively trying to lose weight;  Sees GYN regularly;      Objective:  Vitals:   09/13/20 1044  BP: 122/60  Pulse: 63   Temp: 98.1 F (36.7 C)  TempSrc: Oral  SpO2: 99%  Weight: 148 lb (67.1 kg)  Height: $Remove'5\' 4"'LnfKbod$  (1.626 m)    General: Well developed, well nourished, in no acute distress  Skin : Warm and dry.  Head: Normocephalic and atraumatic  Eyes: Sclera and conjunctiva clear; pupils round and reactive to light; extraocular movements intact  Ears: External normal; canals clear; tympanic membranes normal  Oropharynx: Pink, supple. No suspicious lesions  Neck: Supple without thyromegaly, adenopathy  Lungs: Respirations unlabored; clear to auscultation bilaterally without wheeze, rales, rhonchi  CVS exam: normal rate and regular rhythm.  Abdomen: Soft; nontender; nondistended; normoactive bowel sounds; no masses or hepatosplenomegaly  Musculoskeletal: No deformities; no active joint inflammation  Extremities: No edema, cyanosis, clubbing  Vessels: Symmetric bilaterally  Neurologic: Alert and oriented; speech intact; face symmetrical; moves all extremities well; CNII-XII intact without focal deficit   Assessment:  1. Essential hypertension   2. Hypothyroidism, unspecified type   3. Lipid screening     Plan:  With weight loss, am concerned that pressure is too low; cut Losartan back to 25 mg daily; she will start checking her blood pressure and call back with readings. Check TSH today; will adjust Synthroid; reviewed thyroid ultrasound done in 2021; Check lipid panel today;   This visit occurred during the SARS-CoV-2 public  health emergency.  Safety protocols were in place, including screening questions prior to the visit, additional usage of staff PPE, and extensive cleaning of exam room while observing appropriate contact time as indicated for disinfecting solutions.    No follow-ups on file.  Orders Placed This Encounter  Procedures   CBC with Differential/Platelet   Comp Met (CMET)   TSH   Lipid panel    Requested Prescriptions   Signed Prescriptions Disp Refills   losartan (COZAAR) 25  MG tablet 90 tablet 1    Sig: Take 1 tablet (25 mg total) by mouth daily.

## 2020-09-16 MED ORDER — LEVOTHYROXINE SODIUM 75 MCG PO TABS: 75.0000 ug | ORAL_TABLET | Freq: Every day | ORAL | 0 refills | Status: DC

## 2020-09-16 NOTE — Addendum Note (Signed)
Addended by: Kittie Plater, Velencia Lenart HUA on: 09/16/2020 01:36 PM   Modules accepted: Orders

## 2020-09-17 ENCOUNTER — Other Ambulatory Visit: Payer: Self-pay | Admitting: Family

## 2020-09-17 DIAGNOSIS — E039 Hypothyroidism, unspecified: Secondary | ICD-10-CM

## 2020-09-19 ENCOUNTER — Encounter: Payer: Self-pay | Admitting: Family

## 2020-09-19 NOTE — Progress Notes (Signed)
Mammogram documented.

## 2020-10-21 ENCOUNTER — Other Ambulatory Visit: Payer: Self-pay | Admitting: Family

## 2020-11-01 ENCOUNTER — Encounter: Payer: Self-pay | Admitting: Family

## 2020-11-01 ENCOUNTER — Other Ambulatory Visit: Payer: Self-pay

## 2020-11-01 ENCOUNTER — Ambulatory Visit (INDEPENDENT_AMBULATORY_CARE_PROVIDER_SITE_OTHER): Payer: 59 | Admitting: Family

## 2020-11-01 VITALS — BP 118/60 | HR 71 | Temp 97.9°F | Ht 64.0 in | Wt 148.4 lb

## 2020-11-01 DIAGNOSIS — R251 Tremor, unspecified: Secondary | ICD-10-CM | POA: Diagnosis not present

## 2020-11-01 DIAGNOSIS — R200 Anesthesia of skin: Secondary | ICD-10-CM

## 2020-11-01 DIAGNOSIS — R202 Paresthesia of skin: Secondary | ICD-10-CM

## 2020-11-01 DIAGNOSIS — E039 Hypothyroidism, unspecified: Secondary | ICD-10-CM

## 2020-11-01 LAB — TSH: TSH: 2.01 u[IU]/mL (ref 0.35–5.50)

## 2020-11-01 LAB — MAGNESIUM: Magnesium: 2 mg/dL (ref 1.5–2.5)

## 2020-11-01 LAB — VITAMIN B12: Vitamin B-12: 284 pg/mL (ref 211–911)

## 2020-11-01 NOTE — Progress Notes (Signed)
Krystal Myers is a 50 y.o. 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 90 tablet 0   losartan (COZAAR) 25 MG tablet Take 1 tablet (25 mg total) by mouth daily. 90 tablet 1   neomycin-polymyxin-hydrocortisone (CORTISPORIN) OTIC solution Place 3 drops into both ears 4 (four) times daily. 10 mL 1   oxybutynin (DITROPAN-XL) 10 MG 24 hr tablet oxybutynin chloride ER 10 mg tablet,extended release 24 hr     No current facility-administered medications for this visit.    Allergies: Latex  Past Medical History:  Diagnosis Date   History of chicken pox     Past Surgical History:  Procedure Laterality Date   BREAST ENHANCEMENT SURGERY      Family History  Problem Relation Age of Onset   Hypertension Father    Hyperlipidemia Father    Hypertension Sister    Healthy Mother    Healthy Maternal Grandmother    Healthy Maternal Grandfather    Healthy Paternal Grandmother    Healthy Paternal Grandfather     Social History   Tobacco Use   Smoking status: Former    Types: Cigarettes   Smokeless tobacco: Never   Tobacco comments:    Quit over 18 years ago  Substance Use Topics   Alcohol use: Yes    Alcohol/week: 1.0 standard drink    Types: 1 Glasses of wine per week    Subjective:  Patient presents with concerns for increased episodes of tremor. Symptoms present prior to COVID- actually thinks may have been present for 3-4 years; very random episodes but patient noticing more herself lately/ seem to have worsened in the past few months; denies  any episodes of falling or loss of balance; symptoms localized to hands; notes that her father had similar issues;    Objective:  Vitals:   11/01/20 1119  BP: 118/60  Pulse: 71  Temp: 97.9 F (36.6 C)  TempSrc: Oral  SpO2: 99%  Weight: 148 lb 6.4 oz (67.3 kg)  Height: '5\' 4"'$  (1.626 m)    General: Well developed, well nourished, in no acute distress  Skin : Warm and dry.  Head: Normocephalic and atraumatic  Eyes: Sclera and conjunctiva clear; pupils round and reactive to light; extraocular movements intact  Ears: External normal; canals clear; tympanic membranes normal  Oropharynx: Pink, supple. No suspicious lesions  Neck: Supple without thyromegaly, adenopathy  Lungs: Respirations unlabored; clear to auscultation bilaterally without wheeze, rales, rhonchi  CVS exam: normal rate and regular rhythm.  Musculoskeletal: No deformities; no active joint inflammation  Extremities: No edema, cyanosis, clubbing  Vessels: Symmetric bilaterally  Neurologic: Alert and oriented; speech intact; face symmetrical; moves all extremities well; CNII-XII intact without focal deficit; no tremor noted on exam today  Assessment:  1. Hypothyroidism, unspecified type   2. Numbness and tingling   3. Tremor of both hands     Plan:  Check TSH; Check B12 and magnesium; ? Benign essential tremor; will refer to neurology for complete evaluation; follow up to be determined;  This visit occurred during the SARS-CoV-2 public health emergency.  Safety protocols were in place, including screening questions prior to the visit, additional usage of staff PPE, and extensive cleaning of exam room while observing appropriate contact time as indicated for disinfecting solutions.    No follow-ups on file.  Orders Placed This Encounter  Procedures   TSH   B12   Magnesium   Ambulatory referral to Neurology    Referral Priority:   Routine    Referral Type:   Consultation    Referral Reason:   Specialty Services  Required    Requested Specialty:   Neurology    Number of Visits Requested:   1    Requested Prescriptions    No prescriptions requested or ordered in this encounter

## 2020-11-01 NOTE — Patient Instructions (Signed)
Essential Tremor A tremor is trembling or shaking that a person cannot control. Most tremors affect the hands or arms. Tremors can also affect the head, vocal cords, legs, and other parts of the body. Essential tremor is a tremor without a known cause. Usually, it occurs while a person is trying to perform an action. Ittends to get worse gradually as a person ages. What are the causes? The cause of this condition is not known. What increases the risk? You are more likely to develop this condition if: You have a family member with essential tremor. You are age 40 or older. You take certain medicines. What are the signs or symptoms? The main sign of a tremor is a rhythmic shaking of certain parts of your body that is uncontrolled and unintentional. You may: Have difficulty eating with a spoon or fork. Have difficulty writing. Nod your head up and down or side to side. Have a quivering voice. The shaking may: Get worse over time. Come and go. Be more noticeable on one side of your body. Get worse due to stress, fatigue, caffeine, and extreme heat or cold. How is this diagnosed? This condition may be diagnosed based on: Your symptoms and medical history. A physical exam. There is no single test to diagnose an essential tremor. However, your health care provider may order tests to rule out other causes of your condition. These may include: Blood and urine tests. Imaging studies of your brain, such as CT scan and MRI. A test that measures involuntary muscle movement (electromyogram). How is this treated? Treatment for essential tremor depends on the severity of the condition. Some tremors may go away without treatment. Mild tremors may not need treatment if they do not affect your day-to-day life. Severe tremors may need to be treated using one or more of the following options: Medicines. Lifestyle changes. Occupational or physical therapy. Follow these instructions at  home: Lifestyle  Do not use any products that contain nicotine or tobacco, such as cigarettes and e-cigarettes. If you need help quitting, ask your health care provider. Limit your caffeine intake as told by your health care provider. Try to get 8 hours of sleep each night. Find ways to manage your stress that fits your lifestyle and personality. Consider trying meditation or yoga. Try to anticipate stressful situations and allow extra time to manage them. If you are struggling emotionally with the effects of your tremor, consider working with a mental health provider.  General instructions Take over-the-counter and prescription medicines only as told by your health care provider. Avoid extreme heat and extreme cold. Keep all follow-up visits as told by your health care provider. This is important. Visits may include physical therapy visits. Contact a health care provider if: You experience any changes in the location or intensity of your tremors. You start having a tremor after starting a new medicine. You have tremor with other symptoms, such as: Numbness. Tingling. Pain. Weakness. Your tremor gets worse. Your tremor interferes with your daily life. You feel down, blue, or sad for at least 2 weeks in a row. Worrying about your tremor and what other people think about you interferes with your everyday life functions, including relationships, work, or school. Summary Essential tremor is a tremor without a known cause. Usually, it occurs when you are trying to perform an action. You are more likely to develop this condition if you have a family member with essential tremor. The main sign of a tremor is a rhythmic shaking of   certain parts of your body that is uncontrolled and unintentional. Treatment for essential tremor depends on the severity of the condition. This information is not intended to replace advice given to you by your health care provider. Make sure you discuss any  questions you have with your healthcare provider. Document Revised: 12/08/2019 Document Reviewed: 12/08/2019 Elsevier Patient Education  2022 [employer]  

## 2020-11-30 ENCOUNTER — Other Ambulatory Visit: Payer: Self-pay | Admitting: Family

## 2020-12-11 ENCOUNTER — Other Ambulatory Visit: Payer: Self-pay | Admitting: Family

## 2020-12-11 DIAGNOSIS — E039 Hypothyroidism, unspecified: Secondary | ICD-10-CM

## 2021-01-29 ENCOUNTER — Ambulatory Visit (INDEPENDENT_AMBULATORY_CARE_PROVIDER_SITE_OTHER): Payer: 59 | Admitting: Neurology

## 2021-01-29 ENCOUNTER — Encounter: Payer: Self-pay | Admitting: Neurology

## 2021-01-29 VITALS — BP 123/81 | HR 71 | Ht 64.0 in | Wt 149.0 lb

## 2021-01-29 DIAGNOSIS — G250 Essential tremor: Secondary | ICD-10-CM

## 2021-01-29 NOTE — Progress Notes (Signed)
Subjective:    Patient ID: Krystal Myers is a 50 y.o. female.  HPI    Star Age, MD, PhD [facility] [address] [place], [place] [address]  Dear Mickel Baas,   I saw your patient, Krystal Myers, upon your kind request in neurologic clinic today for initial consultation of her tremors.  The patient is unaccompanied today.  As you know, Krystal Myers is a 50 year old right-handed woman with an underlying medical history of hypothyroidism, and osteoarthritis, who reports a bilateral upper extremity tremor for the past 3 to 4 years.  Symptoms have been mild and intermittent and if anything have progressed very little but she has noticed social embarrassment from the tremor standpoint as she has been noted to have trembling by coworkers and friends and family in the past.  She is not necessarily debilitated by the tremor.  It is a little bit more noticeable in the left hand particularly when she holds something with heft.  She has recently started an oral B12 supplement after recent blood work.  She recalls that her father had hand tremors in his later years, he lived to be in his 87s.  She is the fifth of a total of 6 siblings, she has 2 sisters and 3 brothers, none with tremors as she recalls.  Worse when she is tired and nervous or when it is cold.   I reviewed your visit note from 11/01/2020.  I reviewed her blood work from her visit at the time including TSH, B12, and magnesium.  TSH was normal at 2.01 at the time, magnesium was 2.0, vitamin B12 was on the lower end at 284 at the time.  She was advised to start an over-the-counter vitamin B12 supplement. She admits that she does not hydrate well with water on average daily, she may have 1 regular sized bottle of water.  She does like to drink chocolate milk, up to 3 servings per day.  She works for [employer] as a Librarian, academic.  She is single and lives alone, she has no children.  She does  not drink caffeine otherwise on a day-to-day basis, she drinks alcohol in the form of wine, up to 5 glasses/week, she is a non-smoker.  She has 2 cats and 1 dog in the household.   Her Past Medical History Is Significant For: Past Medical History:  Diagnosis Date   History of chicken pox     Her Past Surgical History Is Significant For: Past Surgical History:  Procedure Laterality Date   BREAST ENHANCEMENT SURGERY      Her Family History Is Significant For: Family History  Problem Relation Age of Onset   Healthy Mother    Hypertension Father    Hyperlipidemia Father    Tremor Father    Hypertension Sister    Healthy Maternal Grandmother    Healthy Maternal Grandfather    Healthy Paternal Grandmother    Healthy Paternal Grandfather     Her Social History Is Significant For: Social History   Socioeconomic History   Marital status: Single    Spouse name: Not on file   Number of children: 0   Years of education: 14   Highest education level: Not on file  Occupational History   Occupation: Librarian, academic  Tobacco Use   Smoking status: Former    Types: Cigarettes   Smokeless tobacco: Never   Tobacco comments:    Quit over 18 years ago  Scientific laboratory technician  Use: Never used  Substance and Sexual Activity   Alcohol use: Yes    Alcohol/week: 5.0 standard drinks    Types: 5 Glasses of wine per week   Drug use: No   Sexual activity: Yes    Birth control/protection: Pill  Other Topics Concern   Not on file  Social History Narrative   Fun: Yard work, walk with her dogs   Denies abuse and feels safe at home.    Social Determinants of Health   Financial Resource Strain: Not on file  Food Insecurity: Not on file  Transportation Needs: Not on file  Physical Activity: Not on file  Stress: Not on file  Social Connections: Not on file    Her Allergies Are:  Allergies  Allergen Reactions   Latex Itching  :   Her Current Medications Are:  Outpatient Encounter  Medications as of 01/29/2021  Medication Sig   Cyanocobalamin (B-12 PO) Take by mouth daily.   levothyroxine (SYNTHROID) 75 MCG tablet TAKE 1 TABLET BY MOUTH EVERY DAY   losartan (COZAAR) 25 MG tablet Take 1 tablet (25 mg total) by mouth daily.   neomycin-polymyxin-hydrocortisone (CORTISPORIN) OTIC solution Place 3 drops into both ears 4 (four) times daily.   oxybutynin (DITROPAN-XL) 10 MG 24 hr tablet oxybutynin chloride ER 10 mg tablet,extended release 24 hr   VITAMIN D PO Take by mouth daily.   No facility-administered encounter medications on file as of 01/29/2021.  :   Review of Systems:  Out of a complete 14 point review of systems, all are reviewed and negative with the exception of these symptoms as listed below:  Review of Systems  Neurological:        Pt is here for tremors . Pt states tremors in both hands bit more noticeable in left hand . Pt states when she is doing an activity or when its cold pt states she sees a increase in tremors    Objective:  Neurological Exam  Physical Exam Physical Examination:   Vitals:   01/29/21 1445  BP: 123/81  Pulse: 71   General Examination: The patient is a very pleasant 50 y.o. female in no acute distress. She appears well-developed and well-nourished and well groomed.   HEENT: Normocephalic, atraumatic, pupils are equal, round and reactive to light, extraocular tracking is good without limitation to gaze excursion or nystagmus noted. Normal smooth pursuit is noted. Hearing is grossly intact. Face is symmetric with normal facial animation and normal facial sensation. Speech is clear with no dysarthria noted. There is no hypophonia. There is no lip, neck/head, jaw or voice tremor. Neck is supple with full range of passive and active motion. There are no carotid bruits on auscultation. Oropharynx exam reveals: mild mouth dryness, good dental hygiene. Tongue protrudes centrally and palate elevates symmetrically.   Chest: Clear to  auscultation without wheezing, rhonchi or crackles noted.  Heart: S1+S2+0, regular and normal without murmurs, rubs or gallops noted.   Abdomen: Soft, non-tender and non-distended.  Extremities: There is no pitting edema in the distal lower extremities bilaterally. Pedal pulses are intact.  Skin: Warm and dry without trophic changes noted.  Musculoskeletal: exam reveals no obvious joint deformities, tenderness or joint swelling or erythema.   Neurologically:  Mental status: The patient is awake, alert and oriented in all 4 spheres. Her immediate and remote memory, attention, language skills and fund of knowledge are appropriate. There is no evidence of aphasia, agnosia, apraxia or anomia. Speech is clear with normal prosody and  enunciation. Thought process is linear. Mood is normal and affect is normal.  Cranial nerves II - XII are as described above under HEENT exam. In addition: shoulder shrug is normal with equal shoulder height noted. Motor exam: Normal bulk, strength and tone is noted. There is no drift, resting tremor or rebound.   On 01/29/2021: On Archimedes spiral drawing she has very slight trembling of the right hand, also with the left hand with more insecurity with the left hand, handwriting is legible, not particularly tremulous, on the smaller side.  She has a very mild/slight postural tremor in both extremities, and hands, very slight action tremor in both upper extremities with the hands, no intention tremor, no resting tremor, no lower extremity tremor.  Romberg is negative. Reflexes are 2+ throughout. Babinski: Toes are flexor bilaterally. Fine motor skills and coordination: intact with normal finger taps, normal hand movements, normal rapid alternating patting, normal foot taps and normal foot agility.  Cerebellar testing: No dysmetria or intention tremor on finger to nose testing. Heel to shin is unremarkable bilaterally. There is no truncal or gait ataxia.  Sensory exam:  intact to light touch, vibration, temperature sense in the upper and lower extremities.  Gait, station and balance: She stands easily. No veering to one side is noted. No leaning to one side is noted. Posture is age-appropriate and stance is narrow based. Gait shows normal stride length and normal pace. No problems turning are noted. Tandem walk is unremarkable.   Assessment and Plan:  Assessment and Plan:  In summary, Krystal Myers is a very pleasant 50 y.o.-year old female with an underlying medical history of hypothyroidism, and osteoarthritis, who presents for evaluation of her tremor disorder of 3 to 4 years duration.  She reports a mild bilateral upper extremity tremor, mostly intermittent in presentation, not debilitating.  She also reports a family history of tremors affecting her dad.  She does not think any of her siblings has tremors.  Her history, family history and presentation are supportive of essential tremor.  She has a very mild presentation and is largely reassured today.  I did not see any signs of parkinsonism in particular.  We talked about tremor triggers and alleviating factors.  She is advised to make sure that she stays really well-hydrated, there is probably room for improvement there.  She does not overindulge in caffeine or alcohol.  She is advised that certain medications can sometimes exacerbate tremors but she is currently not on anything that would be a culprit here.  She is advised regarding potential symptomatic medications.  A beta-blocker may be a good choice for her.  She is agreeable to considering it but we mutually agreed to monitor her symptoms and exam for now.  She is going to try to make an effort to stay better hydrated with water.  I would like to see her back routinely in this clinic for reevaluation in about a year from now, sooner if needed.  She can always call us or email [place] through [facility] in the interim.  We talked about the use of a beta-blocker such as  propranolol in low-dose, potential side effects and limitation as well as expectation with this medication.  We can pick up our discussion at the next appointment or she can also email [place] in the interim if she would like to give it a try.  It could be used on an as-needed basis as well.  I answered all her questions today and she was in  agreement with our plan. Thank you very much for allowing me to participate in the care of this nice patient. If I can be of any further assistance to you please do not hesitate to call me at [phone].  Sincerely,   Star Age, MD, PhD He Does not want to from [facility] at this [place] for staged as above including a complete

## 2021-01-29 NOTE — Patient Instructions (Signed)
It was nice to meet you today.  You have a rather mild tremor of both hands. Your history, family history and exam findings do support a diagnosis of mild essential tremor.  I do not see any signs or symptoms of parkinson's like disease or what we call parkinsonism.   For your tremor, I would not recommend any new medication just yet but we can certainly consider a medication called a beta-blocker in the near future if you would like.  We typically use a medication called propranolol for symptomatic control of this type of tremor.    Please remember, that any kind of tremor may be exacerbated by anxiety, anger, nervousness, excitement, suboptimal hydration or dehydration, thyroid disease, sleep deprivation, by caffeine, and low blood sugar values or blood sugar fluctuations. Some medications can exacerbate tremors, this includes certain antidepressant medications.  You are currently not on any medication that would be likely to cause tremor exacerbation.  For now, we can continue to monitor your symptoms and examination.  Please try to hydrate better with water, 3-4 bottles of water generally recommended for the average adult, 16.9 ounce size each.  Please follow-up routinely in 1 year, you can always get in touch with [place] via phone call or MyChart messaging.

## 2021-03-07 ENCOUNTER — Other Ambulatory Visit: Payer: Self-pay | Admitting: Family

## 2021-03-07 DIAGNOSIS — I10 Essential (primary) hypertension: Secondary | ICD-10-CM

## 2021-03-12 ENCOUNTER — Other Ambulatory Visit: Payer: Self-pay | Admitting: Family

## 2021-03-12 DIAGNOSIS — E039 Hypothyroidism, unspecified: Secondary | ICD-10-CM

## 2021-06-10 ENCOUNTER — Other Ambulatory Visit: Payer: Self-pay | Admitting: Family

## 2021-06-10 DIAGNOSIS — E039 Hypothyroidism, unspecified: Secondary | ICD-10-CM

## 2021-06-11 MED ORDER — LEVOTHYROXINE SODIUM 75 MCG PO TABS: 75.0000 ug | ORAL_TABLET | Freq: Every day | ORAL | 0 refills | Status: DC

## 2021-09-02 ENCOUNTER — Other Ambulatory Visit: Payer: Self-pay | Admitting: Family

## 2021-09-02 DIAGNOSIS — E039 Hypothyroidism, unspecified: Secondary | ICD-10-CM

## 2021-09-02 MED ORDER — LEVOTHYROXINE SODIUM 75 MCG PO TABS: 75.0000 ug | ORAL_TABLET | Freq: Every day | ORAL | 0 refills | Status: DC

## 2021-09-05 ENCOUNTER — Other Ambulatory Visit: Payer: Self-pay | Admitting: Family

## 2021-09-05 ENCOUNTER — Other Ambulatory Visit: Payer: Self-pay | Admitting: Obstetrics and Gynecology

## 2021-09-05 DIAGNOSIS — Z1231 Encounter for screening mammogram for malignant neoplasm of breast: Secondary | ICD-10-CM

## 2021-09-07 ENCOUNTER — Other Ambulatory Visit: Payer: Self-pay | Admitting: Family

## 2021-09-07 DIAGNOSIS — E039 Hypothyroidism, unspecified: Secondary | ICD-10-CM

## 2021-09-09 ENCOUNTER — Other Ambulatory Visit: Payer: Self-pay | Admitting: Family

## 2021-09-09 DIAGNOSIS — I10 Essential (primary) hypertension: Secondary | ICD-10-CM

## 2021-09-23 ENCOUNTER — Ambulatory Visit
Admission: RE | Admit: 2021-09-23 | Discharge: 2021-09-23 | Disposition: A | Discharge status: Home / Self Care | Payer: 59 | Source: Ambulatory Visit | Attending: Obstetrics and Gynecology | Admitting: Obstetrics and Gynecology

## 2021-09-23 DIAGNOSIS — Z1231 Encounter for screening mammogram for malignant neoplasm of breast: Secondary | ICD-10-CM

## 2021-11-12 ENCOUNTER — Encounter: Payer: Self-pay | Admitting: Family

## 2021-11-12 DIAGNOSIS — Z1211 Encounter for screening for malignant neoplasm of colon: Secondary | ICD-10-CM

## 2021-11-14 IMAGING — US US THYROID
1 series · 14 of 25 positions shown · non-contrast
Comparison: None.

CLINICAL DATA: Hypothyroidism

EXAM:
THYROID ULTRASOUND
TECHNIQUE: Ultrasound examination of the thyroid gland and adjacent soft
tissues was performed.

[Series 1: us thyroid · 0.04mm/px · 14 of 38 slices shown]
[im 1/38]
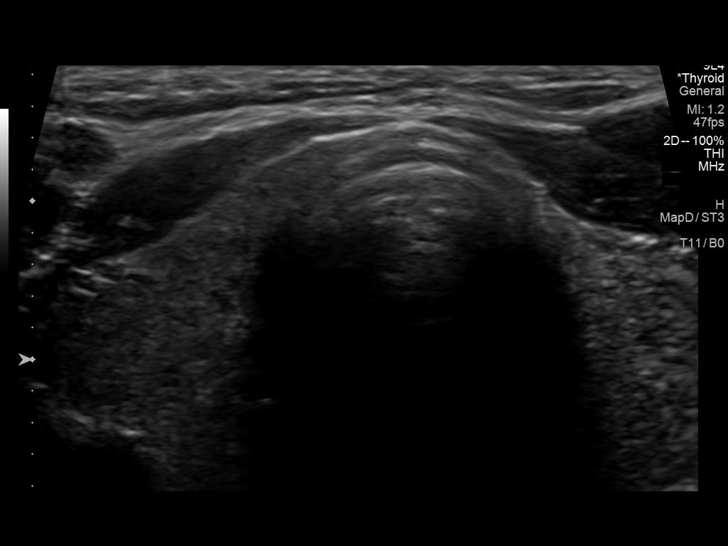
[im 4/38]
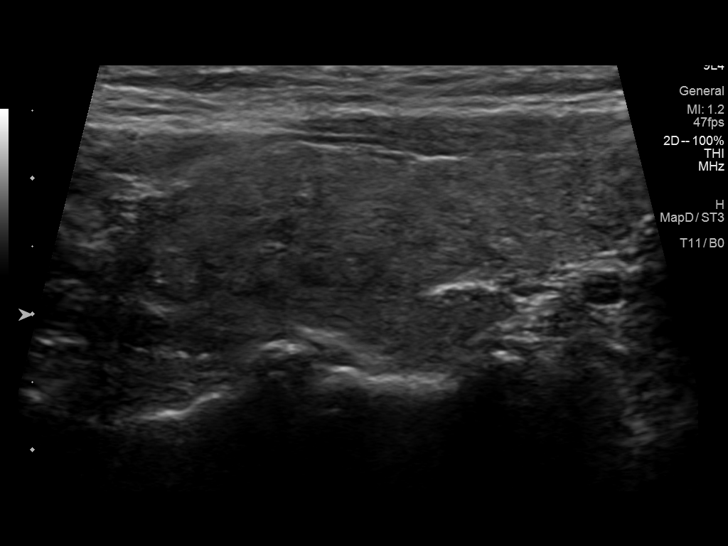
[im 7/38]
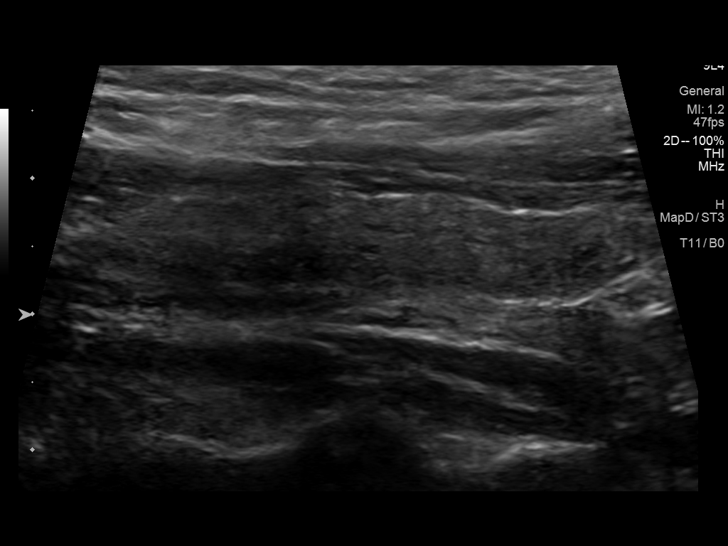
[im 10/38]
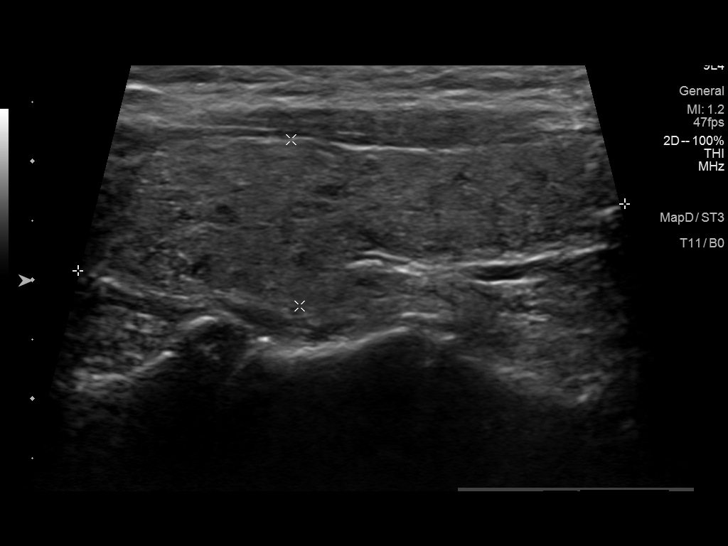
[im 13/38]
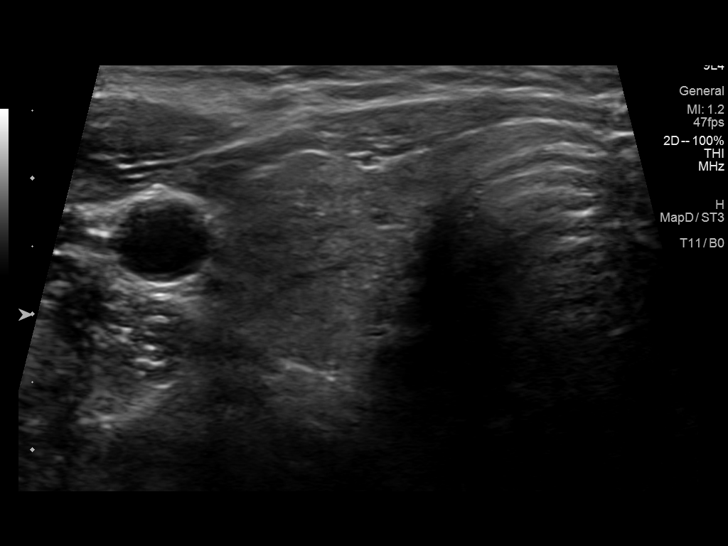
[im 14/38]
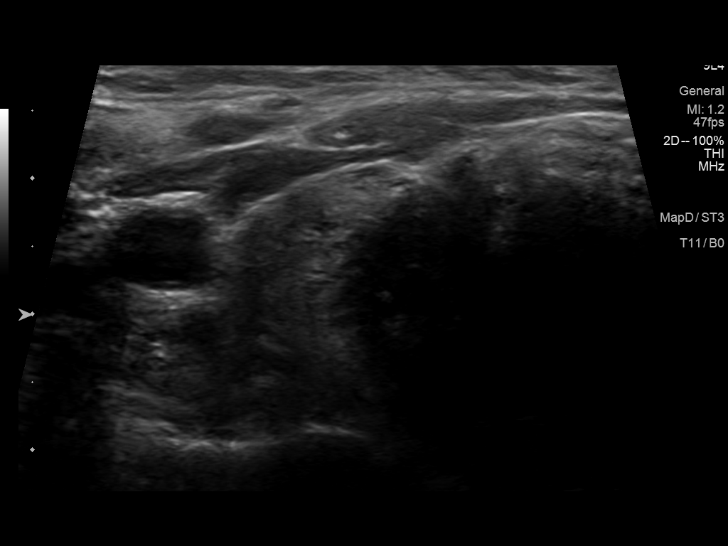
[im 17/38]
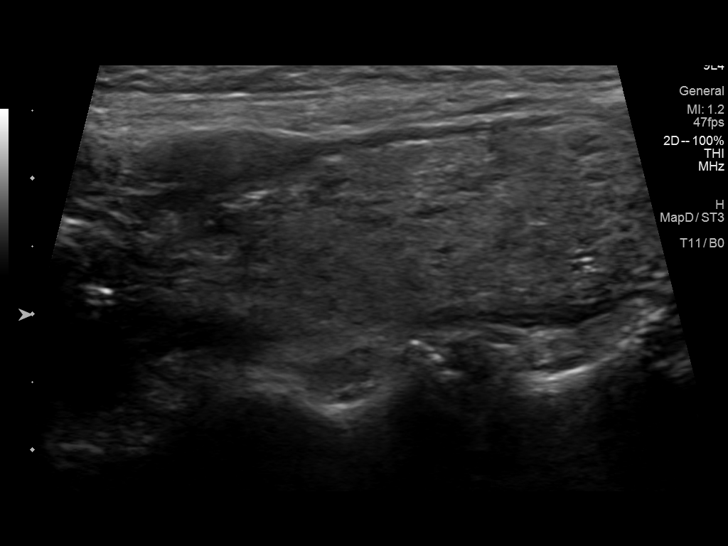
[im 21/38]
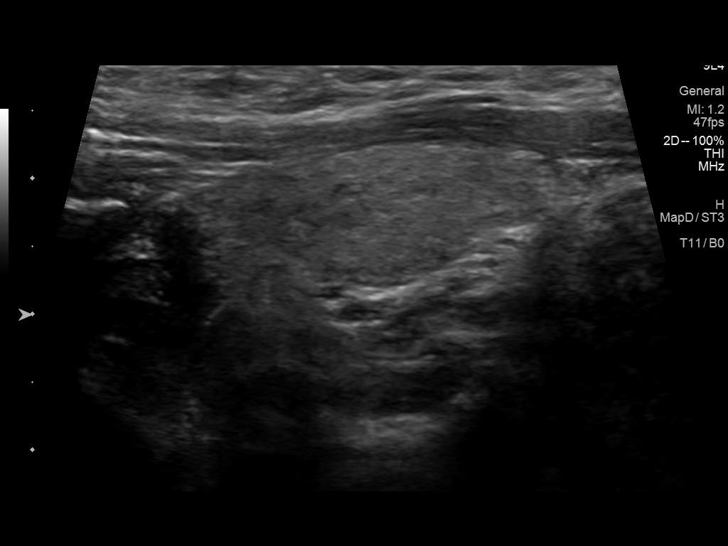
[im 24/38]
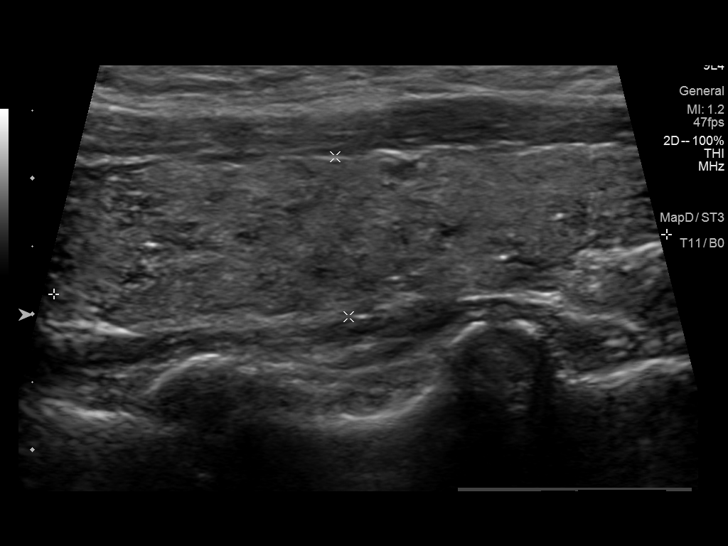
[im 25/38]
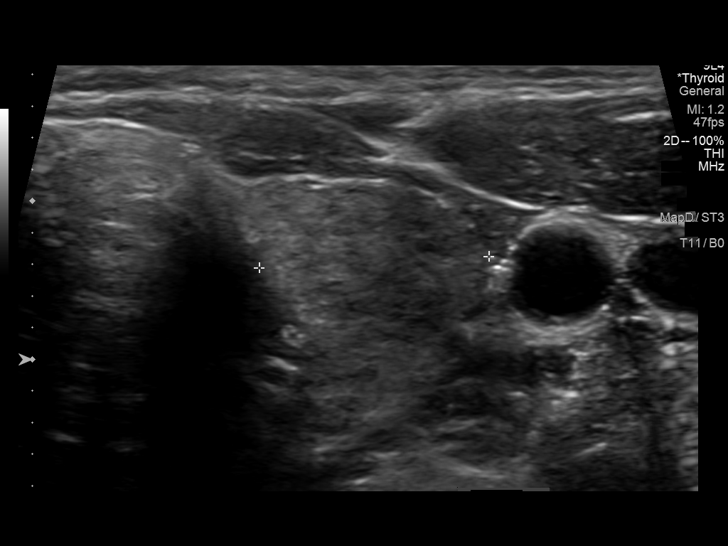
[im 28/38]
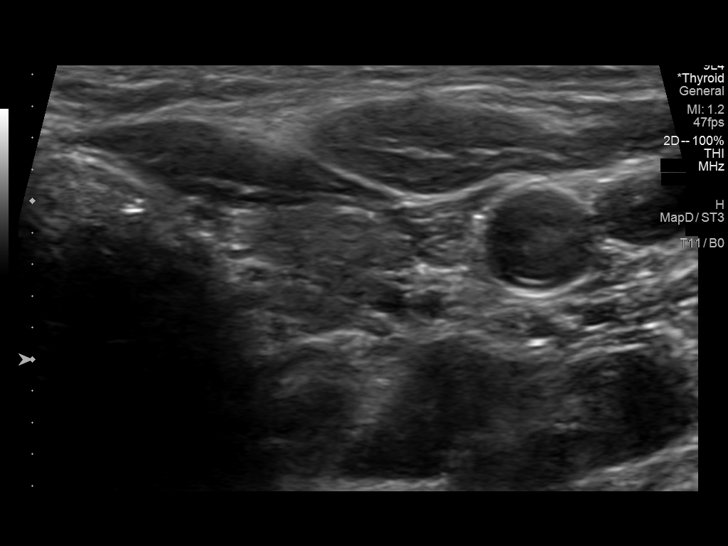
[im 31/38]
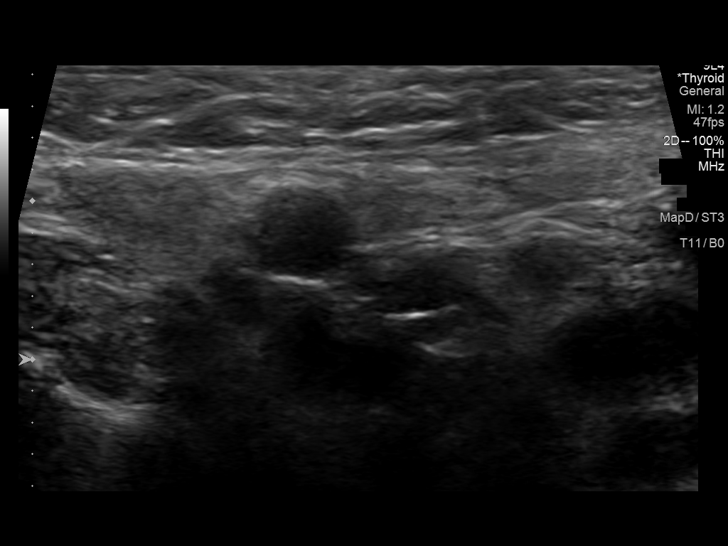
[im 34/38]
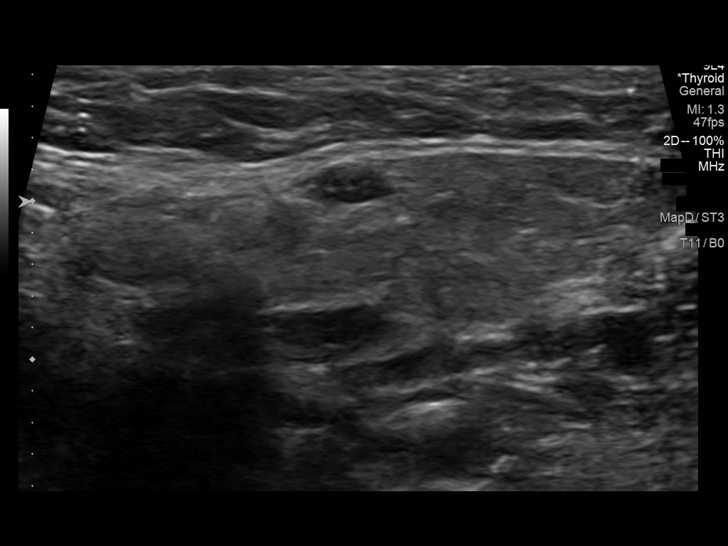
[im 38/38]
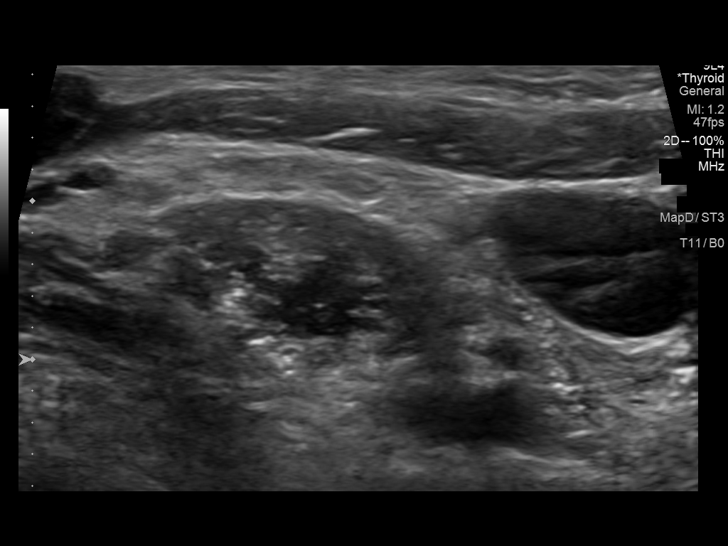

[14 of 25 positions shown; findings below may reference images not displayed]

FINDINGS: Parenchymal Echotexture: Moderately heterogenous

Isthmus: 0.1 cm

Right lobe: 4.6 x 1.4 x 1.4 cm

Left lobe: 4.5 x 1.2 x 1.5 cm

_________________________________________________________

Estimated total number of nodules >/= 1 cm: 0

Number of spongiform nodules >/=  2 cm not described below (TR1): 0

Number of mixed cystic and solid nodules >/= 1.5 cm not described
below (TR2): 0

_________________________________________________________

Nonspecific thyroid heterogeneity without discrete nodule or focal
abnormality. No hypervascularity. No regional adenopathy.
IMPRESSION: Nonspecific moderate thyroid heterogeneity compatible with medical
thyroid disease.

The above is in keeping with the ACR TI-RADS recommendations - [HOSPITAL] 4000;[DATE].

## 2021-11-21 ENCOUNTER — Encounter: Payer: Self-pay | Admitting: Gastroenterology

## 2021-12-04 ENCOUNTER — Other Ambulatory Visit: Payer: Self-pay | Admitting: Family

## 2021-12-04 ENCOUNTER — Ambulatory Visit: Payer: 59 | Admitting: *Deleted

## 2021-12-04 ENCOUNTER — Telehealth: Payer: Self-pay | Admitting: Family

## 2021-12-04 VITALS — Ht 64.0 in | Wt 143.8 lb

## 2021-12-04 DIAGNOSIS — Z1211 Encounter for screening for malignant neoplasm of colon: Secondary | ICD-10-CM

## 2021-12-04 DIAGNOSIS — E039 Hypothyroidism, unspecified: Secondary | ICD-10-CM

## 2021-12-04 DIAGNOSIS — I10 Essential (primary) hypertension: Secondary | ICD-10-CM

## 2021-12-04 MED ORDER — NA SULFATE-K SULFATE-MG SULF 17.5-3.13-1.6 GM/177ML PO SOLN: 1.0000 | Freq: Once | ORAL | 0 refills | Status: AC

## 2021-12-04 NOTE — Telephone Encounter (Signed)
She is due for OV; last seen 10/2020;

## 2021-12-04 NOTE — Telephone Encounter (Signed)
Called pt and she is now scheduled for 12/23/21 @ 8:40am.  She said she will need blood work and will be fasting that day.

## 2021-12-04 NOTE — Progress Notes (Signed)
No egg or soy allergy known to patient  No issues known to pt with past sedation with any surgeries or procedures Patient denies ever being told they had issues or difficulty with intubation  No FH of Malignant Hyperthermia Pt is not on diet pills Pt is not on  home 02  Pt is not on blood thinners  Pt denies issues with constipation  No A fib or A flutter Have any cardiac testing pending--no Pt instructed to use [email or web address] or GoodRx for a price reduction on prep   

## 2021-12-16 ENCOUNTER — Encounter: Payer: Self-pay | Admitting: Gastroenterology

## 2021-12-23 ENCOUNTER — Other Ambulatory Visit: Payer: Self-pay | Admitting: Family

## 2021-12-23 ENCOUNTER — Ambulatory Visit (INDEPENDENT_AMBULATORY_CARE_PROVIDER_SITE_OTHER): Payer: 59 | Admitting: Family

## 2021-12-23 ENCOUNTER — Encounter: Payer: Self-pay | Admitting: Family

## 2021-12-23 VITALS — BP 120/60 | HR 61 | Temp 97.7°F | Ht 64.0 in | Wt 146.8 lb

## 2021-12-23 DIAGNOSIS — Z800 Family history of malignant neoplasm of digestive organs: Secondary | ICD-10-CM | POA: Diagnosis not present

## 2021-12-23 DIAGNOSIS — Z13220 Encounter for screening for lipoid disorders: Secondary | ICD-10-CM | POA: Diagnosis not present

## 2021-12-23 DIAGNOSIS — R7989 Other specified abnormal findings of blood chemistry: Secondary | ICD-10-CM

## 2021-12-23 DIAGNOSIS — E039 Hypothyroidism, unspecified: Secondary | ICD-10-CM

## 2021-12-23 DIAGNOSIS — I10 Essential (primary) hypertension: Secondary | ICD-10-CM | POA: Diagnosis not present

## 2021-12-23 LAB — COMPREHENSIVE METABOLIC PANEL
ALT: 20 U/L (ref 0–35)
AST: 19 U/L (ref 0–37)
Albumin: 4.3 g/dL (ref 3.5–5.2)
Alkaline Phosphatase: 56 U/L (ref 39–117)
BUN: 17 mg/dL (ref 6–23)
CO2: 31 mEq/L (ref 19–32)
Calcium: 9.6 mg/dL (ref 8.4–10.5)
Chloride: 102 mEq/L (ref 96–112)
Creatinine, Ser: 0.75 mg/dL (ref 0.40–1.20)
GFR: 91.03 mL/min (ref >60.00)
Glucose, Bld: 102 mg/dL — ABNORMAL HIGH (ref 70–99)
Potassium: 5.1 mEq/L (ref 3.5–5.1)
Sodium: 138 mEq/L (ref 135–145)
Total Bilirubin: 1.3 mg/dL — ABNORMAL HIGH (ref 0.2–1.2)
Total Protein: 7 g/dL (ref 6.0–8.3)

## 2021-12-23 LAB — CBC WITH DIFFERENTIAL/PLATELET
Basophils Absolute: 0 10*3/uL (ref 0.0–0.1)
Basophils Relative: 0.6 % (ref 0.0–3.0)
Eosinophils Absolute: 0.1 10*3/uL (ref 0.0–0.7)
Eosinophils Relative: 2.2 % (ref 0.0–5.0)
HCT: 39.8 % (ref 36.0–46.0)
Hemoglobin: 13.5 g/dL (ref 12.0–15.0)
Lymphocytes Relative: 27.3 % (ref 12.0–46.0)
Lymphs Abs: 1.6 10*3/uL (ref 0.7–4.0)
MCHC: 33.9 g/dL (ref 30.0–36.0)
MCV: 93.5 fl (ref 78.0–100.0)
Monocytes Absolute: 0.6 10*3/uL (ref 0.1–1.0)
Monocytes Relative: 9.6 % (ref 3.0–12.0)
Neutro Abs: 3.5 10*3/uL (ref 1.4–7.7)
Neutrophils Relative %: 60.3 % (ref 43.0–77.0)
Platelets: 241 10*3/uL (ref 150.0–400.0)
RBC: 4.25 Mil/uL (ref 3.87–5.11)
RDW: 12.7 % (ref 11.5–15.5)
WBC: 5.9 10*3/uL (ref 4.0–10.5)

## 2021-12-23 LAB — LIPID PANEL
Cholesterol: 241 mg/dL — ABNORMAL HIGH (ref 0–200)
HDL: 94.6 mg/dL (ref >39.00)
LDL Cholesterol: 136 mg/dL — ABNORMAL HIGH (ref 0–99)
NonHDL: 146.37
Total CHOL/HDL Ratio: 3
Triglycerides: 51 mg/dL (ref 0.0–149.0)
VLDL: 10.2 mg/dL (ref 0.0–40.0)

## 2021-12-23 LAB — TSH: TSH: 2.81 u[IU]/mL (ref 0.35–5.50)

## 2021-12-23 MED ORDER — LEVOTHYROXINE SODIUM 75 MCG PO TABS: 75.0000 ug | ORAL_TABLET | Freq: Every day | ORAL | 3 refills | Status: DC

## 2021-12-23 MED ORDER — LOSARTAN POTASSIUM 25 MG PO TABS: 25.0000 mg | ORAL_TABLET | Freq: Every day | ORAL | 3 refills | Status: DC

## 2021-12-23 MED ORDER — NEOMYCIN-POLYMYXIN-HC 3.5-10000-1 OT SOLN: 3.0000 [drp] | Freq: Four times a day (QID) | OTIC | 1 refills | Status: DC

## 2021-12-23 NOTE — Progress Notes (Signed)
Krystal Myers is a 51 y.o. female with the following history as recorded in EpicCare:  Patient Active Problem List   Diagnosis Date Noted   FHx: colon cancer 12/23/2021   Osteoarthritis of distal interphalangeal (DIP) joint of left middle finger 09/19/2019   Routine general medical examination at a health care facility 01/13/2016   Chronic eczematous otitis externa of both ears 12/08/2012    Current Outpatient Medications  Medication Sig Dispense Refill   Cyanocobalamin (B-12 PO) Take by mouth daily.     levothyroxine (SYNTHROID) 75 MCG tablet TAKE 1 TABLET BY MOUTH EVERY DAY 90 tablet 0   oxybutynin (DITROPAN-XL) 10 MG 24 hr tablet oxybutynin chloride ER 10 mg tablet,extended release 24 hr     VITAMIN D PO Take by mouth daily.     losartan (COZAAR) 25 MG tablet Take 1 tablet (25 mg total) by mouth daily. 90 tablet 3   neomycin-polymyxin-hydrocortisone (CORTISPORIN) OTIC solution Place 3 drops into both ears 4 (four) times daily. 10 mL 1   No current facility-administered medications for this visit.    Allergies: Latex  Past Medical History:  Diagnosis Date   Essential tremor    History of chicken pox    Hypertension    Thyroid disease     Past Surgical History:  Procedure Laterality Date   BREAST ENHANCEMENT SURGERY     WISDOM TOOTH EXTRACTION Bilateral     Family History  Problem Relation Age of Onset   Healthy Mother    Hypertension Father    Hyperlipidemia Father    Tremor Father    Hypertension Sister    Colon cancer Brother 2   Healthy Maternal Grandmother    Healthy Maternal Grandfather    Healthy Paternal Grandmother    Healthy Paternal Grandfather    Stomach cancer Neg Hx    Esophageal cancer Neg Hx     Social History   Tobacco Use   Smoking status: Former    Types: Cigarettes   Smokeless tobacco: Never   Tobacco comments:    Quit over 18 years ago  Substance Use Topics   Alcohol use: Yes    Alcohol/week: 2.0 standard drinks of alcohol     Types: 2 Glasses of wine per week    Subjective:   Follow up on chronic care needs; does need yearly labs updated today; is scheduled for colonoscopy next week; does see GYN regularly;  Defers updating vaccines of any type today;     Objective:  Vitals:   12/23/21 0840  BP: 120/60  Pulse: 61  Temp: 97.7 F (36.5 C)  TempSrc: Oral  SpO2: 97%  Weight: 146 lb 12.8 oz (66.6 kg)  Height: _0  (1.626 m)    General: Well developed, well nourished, in no acute distress  Skin : Warm and dry.  Head: Normocephalic and atraumatic  Eyes: Sclera and conjunctiva clear; pupils round and reactive to light; extraocular movements intact  Ears: External normal; canals clear; tympanic membranes normal  Oropharynx: Pink, supple. No suspicious lesions  Neck: Supple without thyromegaly, adenopathy  Lungs: Respirations unlabored; clear to auscultation bilaterally without wheeze, rales, rhonchi  CVS exam: normal rate and regular rhythm.  Neurologic: Alert and oriented; speech intact; face symmetrical; moves all extremities well; CNII-XII intact without focal deficit   Assessment:  1. Primary hypertension   2. Lipid screening   3. Elevated TSH   4. Essential hypertension   5. FHx: colon cancer     Plan:  Stable; refill updated;  Check lipid screen; Check TSH today; will adjust Synthroid as needed based on labs; 5.   Colonoscopy is scheduled for next week;  She will plan for 1 year follow up; keep planned follow up with urology, gynecology and neurology as scheduled.   Return in about 1 year (around 12/24/2022).  Orders Placed This Encounter  Procedures   CBC with Differential/Platelet   Comp Met (CMET)   Lipid panel   TSH    Requested Prescriptions   Signed Prescriptions Disp Refills   losartan (COZAAR) 25 MG tablet 90 tablet 3    Sig: Take 1 tablet (25 mg total) by mouth daily.   neomycin-polymyxin-hydrocortisone (CORTISPORIN) OTIC solution 10 mL 1    Sig: Place 3 drops into both  ears 4 (four) times daily.

## 2021-12-31 ENCOUNTER — Encounter: Payer: Self-pay | Admitting: Gastroenterology

## 2021-12-31 ENCOUNTER — Ambulatory Visit (AMBULATORY_SURGERY_CENTER): Payer: 59 | Admitting: Gastroenterology

## 2021-12-31 VITALS — BP 98/72 | HR 72 | Temp 96.1°F | Resp 13 | Ht 64.0 in | Wt 143.8 lb

## 2021-12-31 DIAGNOSIS — Z1211 Encounter for screening for malignant neoplasm of colon: Secondary | ICD-10-CM | POA: Diagnosis present

## 2021-12-31 DIAGNOSIS — D120 Benign neoplasm of cecum: Secondary | ICD-10-CM

## 2021-12-31 DIAGNOSIS — K5730 Diverticulosis of large intestine without perforation or abscess without bleeding: Secondary | ICD-10-CM

## 2021-12-31 DIAGNOSIS — Z800 Family history of malignant neoplasm of digestive organs: Secondary | ICD-10-CM

## 2021-12-31 MED ORDER — SODIUM CHLORIDE 0.9 % IV SOLN: 500.0000 mL | Freq: Once | INTRAVENOUS | Status: DC

## 2021-12-31 NOTE — Progress Notes (Signed)
Sedate, gd SR, tolerated procedure well, VSS, report to RN 

## 2021-12-31 NOTE — Progress Notes (Signed)
VS completed by DT.  Pt's states no medical or surgical changes since previsit or office visit.  

## 2021-12-31 NOTE — Progress Notes (Signed)
GASTROENTEROLOGY PROCEDURE H&P NOTE   Primary Care Physician: Marrian Salvage, [place]    Reason for Procedure:  Colon Cancer screening  Plan:    Colonoscopy  Patient is appropriate for endoscopic procedure(s) in the ambulatory ([facility]) setting.  The nature of the procedure, as well as the risks, benefits, and alternatives were carefully and thoroughly reviewed with the patient. Ample time for discussion and questions allowed. The patient understood, was satisfied, and agreed to proceed.     HPI: Krystal Myers is a 51 y.o. female who presents for colonoscopy for routine Colon Cancer screening.  No active GI symptoms.  Fhx n/f brother with colon cancer at age 23. Patient is otherwise without complaints or active issues today.  Past Medical History:  Diagnosis Date   Essential tremor    History of chicken pox    Hypertension    Thyroid disease     Past Surgical History:  Procedure Laterality Date   BREAST ENHANCEMENT SURGERY     WISDOM TOOTH EXTRACTION Bilateral     Prior to Admission medications   Medication Sig Start Date End Date Taking? Authorizing Provider  Cyanocobalamin (B-12 PO) Take by mouth daily.   Yes [provider]  levothyroxine (SYNTHROID) 75 MCG tablet Take 1 tablet (75 mcg total) by mouth daily. 12/23/21  Yes Marrian Salvage, FNP  losartan (COZAAR) 25 MG tablet Take 1 tablet (25 mg total) by mouth daily. 12/23/21  Yes Marrian Salvage, FNP  oxybutynin (DITROPAN-XL) 10 MG 24 hr tablet oxybutynin chloride ER 10 mg tablet,extended release 24 hr   Yes [provider]  VITAMIN D PO Take by mouth daily.   Yes [provider]  neomycin-polymyxin-hydrocortisone (CORTISPORIN) OTIC solution Place 3 drops into both ears 4 (four) times daily. 12/23/21   Marrian Salvage, FNP    Current Outpatient Medications  Medication Sig Dispense Refill   Cyanocobalamin (B-12 PO) Take by mouth daily.     levothyroxine  (SYNTHROID) 75 MCG tablet Take 1 tablet (75 mcg total) by mouth daily. 90 tablet 3   losartan (COZAAR) 25 MG tablet Take 1 tablet (25 mg total) by mouth daily. 90 tablet 3   oxybutynin (DITROPAN-XL) 10 MG 24 hr tablet oxybutynin chloride ER 10 mg tablet,extended release 24 hr     VITAMIN D PO Take by mouth daily.     neomycin-polymyxin-hydrocortisone (CORTISPORIN) OTIC solution Place 3 drops into both ears 4 (four) times daily. 10 mL 1   Current Facility-Administered Medications  Medication Dose Route Frequency Provider Last Rate Last Admin   0.9 %  sodium chloride infusion  500 mL Intravenous Once Girolamo Lortie V, DO        Allergies as of 12/31/2021 - Review Complete 12/31/2021  Allergen Reaction Noted   Latex Itching 09/13/2020    Family History  Problem Relation Age of Onset   Healthy Mother    Hypertension Father    Hyperlipidemia Father    Tremor Father    Hypertension Sister    Colon cancer Brother 20   Healthy Maternal Grandmother    Healthy Maternal Grandfather    Healthy Paternal Grandmother    Healthy Paternal Grandfather    Stomach cancer Neg Hx    Esophageal cancer Neg Hx     Social History   Socioeconomic History   Marital status: Single    Spouse name: Not on file   Number of children: 0   Years of education: 14   Highest education level: Not  on file  Occupational History   Occupation: Supervisor  Tobacco Use   Smoking status: Former    Types: Cigarettes   Smokeless tobacco: Never   Tobacco comments:    Quit over 18 years ago  Vaping Use   Vaping Use: Never used  Substance and Sexual Activity   Alcohol use: Yes    Alcohol/week: 2.0 standard drinks of alcohol    Types: 2 Glasses of wine per week   Drug use: No   Sexual activity: Yes    Birth control/protection: Pill  Other Topics Concern   Not on file  Social History Narrative   Fun: Yard work, walk with her dogs   Denies abuse and feels safe at home.    Social Determinants of Health    Financial Resource Strain: Not on file  Food Insecurity: Not on file  Transportation Needs: Not on file  Physical Activity: Not on file  Stress: Not on file  Social Connections: Not on file  Intimate Partner Violence: Not on file    Physical Exam: Vital signs in last 24 hours: '@BP'$  (!) 151/90   Pulse 83   Temp (!) 96.1 F (35.6 C) (Temporal)   Ht '5\' 4"'$  (1.626 m)   Wt 143 lb 12.8 oz (65.2 kg)   LMP 10/01/2018   SpO2 97%   BMI 24.68 kg/m  GEN: NAD EYE: Sclerae anicteric ENT: MMM CV: Non-tachycardic Pulm: CTA b/l GI: Soft, NT/ND NEURO:  Alert & Oriented x 3   Gerrit Heck, DO [facility]   12/31/2021 8:36 AM

## 2021-12-31 NOTE — Progress Notes (Signed)
Called to room to assist during endoscopic procedure.  Patient ID and intended procedure confirmed with present staff. Received instructions for my participation in the procedure from the performing physician.  

## 2021-12-31 NOTE — Patient Instructions (Signed)
Information on polyps and diverticulosis given to you today.  Await pathology results.  Resume previous diet and medications.  Repeat colonoscopy in 5 years for surveillance and due to family history.   YOU HAD AN ENDOSCOPIC PROCEDURE TODAY AT [facility]:   Refer to the procedure report that was given to you for any specific questions about what was found during the examination.  If the procedure report does not answer your questions, please call your gastroenterologist to clarify.  If you requested that your care partner not be given the details of your procedure findings, then the procedure report has been included in a sealed envelope for you to review at your convenience later.  YOU SHOULD EXPECT: Some feelings of bloating in the abdomen. Passage of more gas than usual.  Walking can help get rid of the air that was put into your GI tract during the procedure and reduce the bloating. If you had a lower endoscopy (such as a colonoscopy or flexible sigmoidoscopy) you may notice spotting of blood in your stool or on the toilet paper. If you underwent a bowel prep for your procedure, you may not have a normal bowel movement for a few days.  Please Note:  You might notice some irritation and congestion in your nose or some drainage.  This is from the oxygen used during your procedure.  There is no need for concern and it should clear up in a day or so.  SYMPTOMS TO REPORT IMMEDIATELY:  Following lower endoscopy (colonoscopy or flexible sigmoidoscopy):  Excessive amounts of blood in the stool  Significant tenderness or worsening of abdominal pains  Swelling of the abdomen that is new, acute  Fever of 100F or higher   For urgent or emergent issues, a gastroenterologist can be reached at any hour by calling [phone]. Do not use MyChart messaging for urgent concerns.    DIET:  We do recommend a small meal at first, but then you may proceed to your regular diet.  Drink  plenty of fluids but you should avoid alcoholic beverages for 24 hours.  ACTIVITY:  You should plan to take it easy for the rest of today and you should NOT DRIVE or use heavy machinery until tomorrow (because of the sedation medicines used during the test).    FOLLOW UP: Our staff will call the number listed on your records the next business day following your procedure.  We will call around 7:15- 8:00 am to check on you and address any questions or concerns that you may have regarding the information given to you following your procedure. If we do not reach you, we will leave a message.     If any biopsies were taken you will be contacted by phone or by letter within the next 1-3 weeks.  Please call us at [phone] if you have not heard about the biopsies in 3 weeks.    SIGNATURES/CONFIDENTIALITY: You and/or your care partner have signed paperwork which will be entered into your electronic medical record.  These signatures attest to the fact that that the information above on your After Visit Summary has been reviewed and is understood.  Full responsibility of the confidentiality of this discharge information lies with you and/or your care-partner.

## 2021-12-31 NOTE — Op Note (Signed)
[facility] Patient Name: Krystal Myers Procedure Date: 12/31/2021 8:46 AM MRN: [id number] Endoscopist: Gerrit Heck , MD Age: 51 Referring MD:  Date of Birth: 1970/06/17 Gender: Female Account #: [id number] Procedure:                Colonoscopy Indications:              Screening in patient at increased risk. Brother                            recently diagnosed with colon cancer. This is the                            patient's first colonoscopy. No recent GI symptoms. Medicines:                Monitored Anesthesia Care Procedure:                Pre-Anesthesia Assessment:                           - Prior to the procedure, a History and Physical                            was performed, and patient medications and                            allergies were reviewed. The patient's tolerance of                            previous anesthesia was also reviewed. The risks                            and benefits of the procedure and the sedation                            options and risks were discussed with the patient.                            All questions were answered, and informed consent                            was obtained. Prior Anticoagulants: The patient has                            taken no previous anticoagulant or antiplatelet                            agents. ASA Grade Assessment: II - A patient with                            mild systemic disease. After reviewing the risks                            and benefits, the patient was deemed in  satisfactory condition to undergo the procedure.                           After obtaining informed consent, the colonoscope                            was passed under direct vision. Throughout the                            procedure, the patient's blood pressure, pulse, and                            oxygen saturations were monitored continuously. The                             Olympus CF-HQ190L (Serial# [id number]) Colonoscope was                            introduced through the anus and advanced to the the                            terminal ileum. The colonoscopy was performed                            without difficulty. The patient tolerated the                            procedure well. The quality of the bowel                            preparation was good. The terminal ileum, ileocecal                            valve, appendiceal orifice, and rectum were                            photographed. Scope In: 8:52:26 AM Scope Out: 9:03:30 AM Scope Withdrawal Time: 0 hours 8 minutes 54 seconds  Total Procedure Duration: 0 hours 11 minutes 4 seconds  Findings:                 The perianal and digital rectal examinations were                            normal.                           A 4 mm polyp was found in the cecum. The polyp was                            sessile. The polyp was removed with a cold snare.                            Resection and retrieval were complete. Estimated  blood loss was minimal.                           A few small-mouthed diverticula were found in the                            sigmoid colon.                           The retroflexed view of the distal rectum and anal                            verge was normal and showed no anal or rectal                            abnormalities.                           The terminal ileum appeared normal. Complications:            No immediate complications. Estimated Blood Loss:     Estimated blood loss was minimal. Impression:               - One 4 mm polyp in the cecum, removed with a cold                            snare. Resected and retrieved.                           - Diverticulosis in the sigmoid colon.                           - The distal rectum and anal verge are normal on                            retroflexion view.                           - The  examined portion of the ileum was normal. Recommendation:           - Patient has a contact number available for                            emergencies. The signs and symptoms of potential                            delayed complications were discussed with the                            patient. Return to normal activities tomorrow.                            Written discharge instructions were provided to the                            patient.                           -  Resume previous diet.                           - Continue present medications.                           - Await pathology results.                           - Repeat colonoscopy in 5 years for surveillance                            and due to family history.                           - Return to GI office PRN. Gerrit Heck, MD 12/31/2021 9:08:25 AM

## 2022-01-01 ENCOUNTER — Telehealth: Payer: Self-pay | Admitting: *Deleted

## 2022-01-01 NOTE — Telephone Encounter (Signed)
  Follow up Call-     12/31/2021    7:42 AM  Call back number  Post procedure Call Back phone  # [phone]  Permission to leave phone message Yes     Patient questions:  Do you have a fever, pain , or abdominal swelling? No. Pain Score  0 *  Have you tolerated food without any problems? Yes.    Have you been able to return to your normal activities? Yes.    Do you have any questions about your discharge instructions: Diet   No. Medications  No. Follow up visit  No.  Do you have questions or concerns about your Care? No.  Actions: * If pain score is 4 or above: No action needed, pain <4.

## 2022-01-06 ENCOUNTER — Encounter: Payer: Self-pay | Admitting: Gastroenterology

## 2022-01-29 ENCOUNTER — Ambulatory Visit: Payer: 59 | Admitting: Neurology

## 2022-02-05 ENCOUNTER — Encounter: Payer: Self-pay | Admitting: Neurology

## 2022-02-05 ENCOUNTER — Ambulatory Visit (INDEPENDENT_AMBULATORY_CARE_PROVIDER_SITE_OTHER): Payer: 59 | Admitting: Neurology

## 2022-02-05 VITALS — BP 125/70 | HR 72 | Ht 64.0 in | Wt 148.2 lb

## 2022-02-05 DIAGNOSIS — G250 Essential tremor: Secondary | ICD-10-CM

## 2022-02-05 NOTE — Patient Instructions (Signed)
It was nice to see you again today.  As discussed, we will continue to monitor your exam and your symptoms with regards to your tremor, exam is stable.  Please try to hydrate well with water, continue to make enough time for sleep and rest.  Manage stress as best as possible.  Follow-up in 1 year.

## 2022-02-05 NOTE — Progress Notes (Signed)
Subjective:    Patient ID: Krystal Myers is a 51 y.o. female.  HPI    Interim history:   Krystal Myers is a 51 year old right-handed woman with an underlying medical history of hypothyroidism, and osteoarthritis, who presents for follow-up consultation of her essential tremor affecting both hands.  The patient is unaccompanied today.  I first met her at the request of her primary care nurse practitioner on 01/29/2021, at which time she reported a 3 to 4-year history of upper extremity tremors.  Findings were in keeping with mild essential tremor.  We talked about potential symptomatic treatment with a beta-blocker but agreed on monitoring her symptoms and exam for now.  She was advised to hydrate well with water and continue to limit her caffeine intake.  Today, 02/05/2022: She reports feeling stable. No major change in her hand tremor, no new tremor such as upper body, head or neck or voice tremor.  She tries to hydrate well, she tries to rest well, she has had some work-related stress.  She had a checkup with her primary care recently including labs on 12/23/2021, TSH was normal at 2.81 at the time.  She has had no new medication changes.  The patient's allergies, current medications, family history, past medical history, past social history, past surgical history and problem list were reviewed and updated as appropriate.   Previously:   01/29/21: (She) reports a bilateral upper extremity tremor for the past 3 to 4 years.  Symptoms have been mild and intermittent and if anything have progressed very little but she has noticed social embarrassment from the tremor standpoint as she has been noted to have trembling by coworkers and friends and family in the past.  She is not necessarily debilitated by the tremor.  It is a little bit more noticeable in the left hand particularly when she holds something with heft.  She has recently started an oral B12 supplement after recent blood work.  She  recalls that her father had hand tremors in his later years, he lived to be in his 70s.  She is the fifth of a total of 6 siblings, she has 2 sisters and 3 brothers, none with tremors as she recalls.  Worse when she is tired and nervous or when it is cold.    I reviewed your visit note from 11/01/2020.  I reviewed her blood work from her visit at the time including TSH, B12, and magnesium.  TSH was normal at 2.01 at the time, magnesium was 2.0, vitamin B12 was on the lower end at 284 at the time.  She was advised to start an over-the-counter vitamin B12 supplement. She admits that she does not hydrate well with water on average daily, she may have 1 regular sized bottle of water.  She does like to drink chocolate milk, up to 3 servings per day.  She works for [employer] as a Librarian, academic.  She is single and lives alone, she has no children.  She does not drink caffeine otherwise on a day-to-day basis, she drinks alcohol in the form of wine, up to 5 glasses/week, she is a non-smoker.  She has 2 cats and 1 dog in the household.   Her Past Medical History Is Significant For: Past Medical History:  Diagnosis Date   Essential tremor    History of chicken pox    Hypertension    Thyroid disease     Her Past Surgical History Is Significant For: Past Surgical History:  Procedure  Laterality Date   BREAST ENHANCEMENT SURGERY     WISDOM TOOTH EXTRACTION Bilateral     Her Family History Is Significant For: Family History  Problem Relation Age of Onset   Healthy Mother    Hypertension Father    Hyperlipidemia Father    Tremor Father    Hypertension Sister    Colon cancer Brother 48   Healthy Maternal Grandmother    Healthy Maternal Grandfather    Healthy Paternal Grandmother    Healthy Paternal Grandfather    Stomach cancer Neg Hx    Esophageal cancer Neg Hx     Her Social History Is Significant For: Social History   Socioeconomic History   Marital status: Single    Spouse name:  Not on file   Number of children: 0   Years of education: 14   Highest education level: Not on file  Occupational History   Occupation: Librarian, academic  Tobacco Use   Smoking status: Former    Types: Cigarettes   Smokeless tobacco: Never   Tobacco comments:    Quit over 18 years ago  Vaping Use   Vaping Use: Never used  Substance and Sexual Activity   Alcohol use: Yes    Alcohol/week: 7.0 standard drinks of alcohol    Types: 7 Glasses of wine per week   Drug use: No   Sexual activity: Yes    Birth control/protection: Pill  Other Topics Concern   Not on file  Social History Narrative   Fun: Yard work, walk with her dogs   Denies abuse and feels safe at home.    Social Determinants of Health   Financial Resource Strain: Not on file  Food Insecurity: Not on file  Transportation Needs: Not on file  Physical Activity: Not on file  Stress: Not on file  Social Connections: Not on file    Her Allergies Are:  Allergies  Allergen Reactions   Latex Itching  :   Her Current Medications Are:  Outpatient Encounter Medications as of 02/05/2022  Medication Sig   Cyanocobalamin (B-12 PO) Take by mouth daily.   levothyroxine (SYNTHROID) 75 MCG tablet Take 1 tablet (75 mcg total) by mouth daily.   losartan (COZAAR) 25 MG tablet Take 1 tablet (25 mg total) by mouth daily.   neomycin-polymyxin-hydrocortisone (CORTISPORIN) OTIC solution Place 3 drops into both ears 4 (four) times daily.   oxybutynin (DITROPAN-XL) 10 MG 24 hr tablet oxybutynin chloride ER 10 mg tablet,extended release 24 hr   VITAMIN D PO Take by mouth daily.   No facility-administered encounter medications on file as of 02/05/2022.  :  Review of Systems:  Out of a complete 14 point review of systems, all are reviewed and negative with the exception of these symptoms as listed below:  Review of Systems  Neurological:        Pt here for tremors f/u pt states no changes since last visit     Objective:  Neurological  Exam  Physical Exam Physical Examination:   Vitals:   02/05/22 0745  BP: 125/70  Pulse: 72    General Examination: The patient is a very pleasant 51 y.o. female in no acute distress. She appears well-developed and well-nourished and well groomed.   HEENT: Normocephalic, atraumatic, pupils are equal, round and reactive to light, extraocular tracking is well-preserved.  Hearing is grossly intact. Face is symmetric with normal facial animation and normal facial sensation. Speech is clear with no dysarthria noted. There is no hypophonia. There is no  lip, neck/head, jaw or voice tremor. Neck is supple with full range of passive and active motion. There are no carotid bruits on auscultation. Oropharynx exam reveals: mild mouth dryness, good dental hygiene. Tongue protrudes centrally and palate elevates symmetrically.    Chest: Clear to auscultation without wheezing, rhonchi or crackles noted.   Heart: S1+S2+0, regular and normal without murmurs, rubs or gallops noted.    Abdomen: Soft, non-tender and non-distended.   Extremities: There is no pitting edema in the distal lower extremities bilaterally. Pedal pulses are intact.   Skin: Warm and dry without trophic changes noted.   Musculoskeletal: exam reveals no obvious joint deformities.    Neurologically:  Mental status: The patient is awake, alert and oriented in all 4 spheres. Her immediate and remote memory, attention, language skills and fund of knowledge are appropriate. There is no evidence of aphasia, agnosia, apraxia or anomia. Speech is clear with normal prosody and enunciation. Thought process is linear. Mood is normal and affect is normal.  Cranial nerves II - XII are as described above under HEENT exam.  Motor exam: Normal bulk, strength and tone is noted. There is no drift, resting tremor or rebound.    (On 01/29/2021: On Archimedes spiral drawing she has very slight trembling of the right hand, also with the left hand with more  insecurity with the left hand, handwriting is legible, not particularly tremulous, on the smaller side.)   She has a slight postural tremor in both extremities, intermittent, no significant action tremor, no intention tremor or resting tremor.     Romberg is negative. Reflexes are 2+ throughout.  Fine motor skills and coordination: intact with normal finger taps, normal hand movements, normal rapid alternating patting, normal foot taps bilaterally.  Cerebellar testing: No dysmetria or intention tremor. Heel to shin is unremarkable bilaterally. There is no truncal or gait ataxia.  Sensory exam: intact to light touch in the upper and lower extremities.  Gait, station and balance: She stands easily. No veering to one side is noted. No leaning to one side is noted. Posture is age-appropriate and stance is narrow based. Gait shows normal stride length and normal pace. No problems turning are noted. Tandem walk is unremarkable.    Assessment and Plan:   In summary, Krystal Myers is a very pleasant 51 year old female with an underlying medical history of hypothyroidism, and osteoarthritis, who presents for follow-up consultation of her hand tremors of about 4 to 5 years duration with a positive family history of tremors, affecting her father.  She feels stable, exam is quite benign today, very little tremor visible.  Symptoms can fluctuate of course, we mutually agreed to continue to monitor her exam findings and her symptoms.  We may consider a low-dose beta-blocker down the road, for now, we will have her come back in about a year, sooner if needed.  We talked about the importance of managing known tremor triggers, she is reminded to stay well rested, well hydrated, stress is much as possible for her. I answered all her questions today and she was in agreement with our plan. I spent 20 minutes in total face-to-face time and in reviewing records during pre-charting, more than 50% of which was spent in  counseling and coordination of care, reviewing test results, reviewing medications and treatment regimen and/or in discussing or reviewing the diagnosis of ET, the prognosis and treatment options. Pertinent laboratory and imaging test results that were available during this visit with the patient were reviewed by  me and considered in my medical decision making (see chart for details).

## 2022-03-09 ENCOUNTER — Encounter: Payer: Self-pay | Admitting: Family

## 2022-03-10 ENCOUNTER — Other Ambulatory Visit: Payer: Self-pay | Admitting: Family

## 2022-03-10 DIAGNOSIS — E039 Hypothyroidism, unspecified: Secondary | ICD-10-CM

## 2022-03-10 MED ORDER — LEVOTHYROXINE SODIUM 88 MCG PO TABS: 88.0000 ug | ORAL_TABLET | Freq: Every day | ORAL | 0 refills | Status: DC

## 2022-03-11 ENCOUNTER — Other Ambulatory Visit: Payer: Self-pay | Admitting: Family

## 2022-03-11 DIAGNOSIS — E039 Hypothyroidism, unspecified: Secondary | ICD-10-CM

## 2022-04-22 ENCOUNTER — Encounter: Payer: Self-pay | Admitting: Family

## 2022-04-23 ENCOUNTER — Other Ambulatory Visit: Payer: Self-pay | Admitting: Family

## 2022-04-23 ENCOUNTER — Other Ambulatory Visit (INDEPENDENT_AMBULATORY_CARE_PROVIDER_SITE_OTHER): Payer: 59

## 2022-04-23 DIAGNOSIS — E039 Hypothyroidism, unspecified: Secondary | ICD-10-CM | POA: Diagnosis not present

## 2022-04-23 LAB — TSH: TSH: 2.26 u[IU]/mL (ref 0.35–5.50)

## 2022-04-23 MED ORDER — SCOPOLAMINE 1 MG/3DAYS TD PT72: 1.0000 | MEDICATED_PATCH | TRANSDERMAL | 0 refills | Status: DC

## 2022-06-04 ENCOUNTER — Other Ambulatory Visit: Payer: Self-pay | Admitting: Family

## 2022-06-04 DIAGNOSIS — E039 Hypothyroidism, unspecified: Secondary | ICD-10-CM

## 2022-08-27 ENCOUNTER — Other Ambulatory Visit: Payer: Self-pay | Admitting: Obstetrics and Gynecology

## 2022-08-27 DIAGNOSIS — Z1231 Encounter for screening mammogram for malignant neoplasm of breast: Secondary | ICD-10-CM

## 2022-09-25 ENCOUNTER — Ambulatory Visit
Admission: RE | Admit: 2022-09-25 | Discharge: 2022-09-25 | Disposition: A | Discharge status: Home / Self Care | Payer: 59 | Source: Ambulatory Visit | Attending: Obstetrics and Gynecology | Admitting: Obstetrics and Gynecology

## 2022-09-25 DIAGNOSIS — Z1231 Encounter for screening mammogram for malignant neoplasm of breast: Secondary | ICD-10-CM

## 2022-12-07 ENCOUNTER — Other Ambulatory Visit: Payer: Self-pay | Admitting: Family

## 2022-12-07 DIAGNOSIS — E039 Hypothyroidism, unspecified: Secondary | ICD-10-CM

## 2023-02-04 ENCOUNTER — Ambulatory Visit: Payer: 59 | Admitting: Neurology

## 2023-02-08 ENCOUNTER — Encounter: Payer: Self-pay | Admitting: Neurology

## 2023-02-08 ENCOUNTER — Ambulatory Visit (INDEPENDENT_AMBULATORY_CARE_PROVIDER_SITE_OTHER): Payer: 59 | Admitting: Neurology

## 2023-02-08 VITALS — BP 125/71 | HR 65 | Ht 64.0 in | Wt 152.6 lb

## 2023-02-08 DIAGNOSIS — G250 Essential tremor: Secondary | ICD-10-CM | POA: Diagnosis not present

## 2023-02-08 NOTE — Progress Notes (Signed)
Subjective:    Patient ID: Krystal Myers is a 52 y.o. female.  HPI    Interim history:   Krystal Myers is a 52 year old right-handed woman with an underlying medical history of hypothyroidism, and osteoarthritis, who presents for follow-up consultation of her essential tremor affecting both hands.  The patient is unaccompanied today.  I last saw her on 02/05/2022, at which time she reported feeling stable.  She had recent blood work with her primary care including thyroid function test.  We mutually agreed to monitor her symptoms and exam and follow-up in 1 year.  Today, 02/08/2023: She reports doing well, no new issues, feels like her tremor has been stable, no issues with balance, no recent falls.  She is pending her physical for this year and blood work.  She tries to hydrate well but could do a little better.  She limits her caffeine.  She drinks alcohol in the form of wine, a couple of times a week.  Tremor is manageable, not bothersome, no problems doing her work related duties.  She reports that she has a habit of picking the skin around her nails and tapping her nails when she has fake nails on.  She also noticed that she has tension in her thigh muscles especially when driving.  She has not had any other symptoms, we talked about the importance of physical and mental relaxation and how her symptoms are probably benign and finger tapping is a common habit.   The patient's allergies, current medications, family history, past medical history, past social history, past surgical history and problem list were reviewed and updated as appropriate.    Previously:    I first met her at the request of her primary care nurse practitioner on 01/29/2021, at which time she reported a 3 to 4-year history of upper extremity tremors.  Findings were in keeping with mild essential tremor.  We talked about potential symptomatic treatment with a beta-blocker but agreed on monitoring her symptoms and exam  for now.  She was advised to hydrate well with water and continue to limit her caffeine intake.     01/29/21: (She) reports a bilateral upper extremity tremor for the past 3 to 4 years.  Symptoms have been mild and intermittent and if anything have progressed very little but she has noticed social embarrassment from the tremor standpoint as she has been noted to have trembling by coworkers and friends and family in the past.  She is not necessarily debilitated by the tremor.  It is a little bit more noticeable in the left hand particularly when she holds something with heft.  She has recently started an oral B12 supplement after recent blood work.  She recalls that her father had hand tremors in his later years, he lived to be in his 15s.  She is the fifth of a total of 6 siblings, she has 2 sisters and 3 brothers, none with tremors as she recalls.  Worse when she is tired and nervous or when it is cold.    I reviewed your visit note from 11/01/2020.  I reviewed her blood work from her visit at the time including TSH, B12, and magnesium.  TSH was normal at 2.01 at the time, magnesium was 2.0, vitamin B12 was on the lower end at 284 at the time.  She was advised to start an over-the-counter vitamin B12 supplement. She admits that she does not hydrate well with water on average daily, she may have 1 regular  sized bottle of water.  She does like to drink chocolate milk, up to 3 servings per day.  She works for [employer] as a Merchandiser, retail.  She is single and lives alone, she has no children.  She does not drink caffeine otherwise on a day-to-day basis, she drinks alcohol in the form of wine, up to 5 glasses/week, she is a non-smoker.  She has 2 cats and 1 dog in the household.    Her Past Medical History Is Significant For: Past Medical History:  Diagnosis Date   Essential tremor    History of chicken pox    Hypertension    Thyroid disease     Her Past Surgical History Is Significant For: Past  Surgical History:  Procedure Laterality Date   BREAST ENHANCEMENT SURGERY     WISDOM TOOTH EXTRACTION Bilateral     Her Family History Is Significant For: Family History  Problem Relation Age of Onset   Healthy Mother    Hypertension Father    Hyperlipidemia Father    Tremor Father    Hypertension Sister    Colon cancer Brother 78   Healthy Maternal Grandmother    Healthy Maternal Grandfather    Healthy Paternal Grandmother    Healthy Paternal Grandfather    Stomach cancer Neg Hx    Esophageal cancer Neg Hx    Parkinson's disease Neg Hx     Her Social History Is Significant For: Social History   Socioeconomic History   Marital status: Single    Spouse name: Not on file   Number of children: 0   Years of education: 14   Highest education level: Not on file  Occupational History   Occupation: Merchandiser, retail  Tobacco Use   Smoking status: Former    Types: Cigarettes   Smokeless tobacco: Never   Tobacco comments:    Quit over 18 years ago  Vaping Use   Vaping status: Never Used  Substance and Sexual Activity   Alcohol use: Yes    Alcohol/week: 5.0 standard drinks of alcohol    Types: 5 Glasses of wine per week   Drug use: No   Sexual activity: Yes    Birth control/protection: Pill  Other Topics Concern   Not on file  Social History Narrative   Fun: Yard work, walk with her dogs   Denies abuse and feels safe at home.    Social Determinants of Health   Financial Resource Strain: Not on file  Food Insecurity: Not on file  Transportation Needs: Not on file  Physical Activity: Not on file  Stress: Not on file  Social Connections: Not on file    Her Allergies Are:  Allergies  Allergen Reactions   Latex Itching  :   Her Current Medications Are:  Outpatient Encounter Medications as of 02/08/2023  Medication Sig   Cyanocobalamin (B-12 PO) Take by mouth daily.   levothyroxine (SYNTHROID) 88 MCG tablet TAKE 1 TABLET BY MOUTH EVERY DAY   losartan (COZAAR) 25  MG tablet Take 1 tablet (25 mg total) by mouth daily.   neomycin-polymyxin-hydrocortisone (CORTISPORIN) OTIC solution Place 3 drops into both ears 4 (four) times daily.   oxybutynin (DITROPAN-XL) 10 MG 24 hr tablet 15 mg.   scopolamine (TRANSDERM SCOP, 1.5 MG,) 1 MG/3DAYS Place 1 patch (1.5 mg total) onto the skin every 3 (three) days.   VITAMIN D PO Take by mouth daily.   No facility-administered encounter medications on file as of 02/08/2023.  :  Review of  Systems:  Out of a complete 14 point review of systems, all are reviewed and negative with the exception of these symptoms as listed below:  Review of Systems  Neurological:        Pt here for tremor f/u Pt states tremors same since last office visit Pt states no questions or concerns for today visit     Objective:  Neurological Exam  Physical Exam Physical Examination:   Vitals:   02/08/23 0726  BP: 125/71  Pulse: 65    General Examination: The patient is a very pleasant 52 y.o. female in no acute distress. She appears well-developed and well-nourished and well groomed.   HEENT: Normocephalic, atraumatic, pupils are equal, round and reactive to light, extraocular tracking is well-preserved.  Hearing is grossly intact. Face is symmetric with normal facial animation and normal facial sensation. Speech is clear with no dysarthria noted. There is no hypophonia. There is no lip, neck/head, jaw or voice tremor. Neck is supple with full range of passive and active motion. There are no carotid bruits on auscultation. Oropharynx exam reveals: mild mouth dryness, good dental hygiene. Tongue protrudes centrally and palate elevates symmetrically, exam overall stable.   Chest: Clear to auscultation without wheezing, rhonchi or crackles noted.   Heart: S1+S2+0, regular and normal without murmurs, rubs or gallops noted.    Abdomen: Soft, non-tender and non-distended.   Extremities: There is no pitting edema in the distal lower  extremities bilaterally.    Skin: Warm and dry without trophic changes noted.   Musculoskeletal: exam reveals no obvious joint deformities.    Neurologically:  Mental status: The patient is awake, alert and oriented in all 4 spheres. Her immediate and remote memory, attention, language skills and fund of knowledge are appropriate. There is no evidence of aphasia, agnosia, apraxia or anomia. Speech is clear with normal prosody and enunciation. Thought process is linear. Mood is normal and affect is normal.  Cranial nerves II - XII are as described above under HEENT exam.  Motor exam: Normal bulk, strength and tone is noted. There is no drift, resting tremor or rebound.    (On 01/29/2021: On Archimedes spiral drawing she has very slight trembling of the right hand, also with the left hand with more insecurity with the left hand, handwriting is legible, not particularly tremulous, on the smaller side.)   She has a slight and intermittent postural tremor in both extremities, stable.     Romberg is negative. Reflexes are 2+ throughout.  Toes are downgoing bilaterally. Fine motor skills and coordination: intact with normal finger taps, normal hand movements, normal rapid alternating patting, normal foot taps bilaterally.  Cerebellar testing: No dysmetria or intention tremor. Heel to shin is unremarkable bilaterally.  Normal finger-to-nose.  There is no truncal or gait ataxia.  Sensory exam: intact to light touch in the upper and lower extremities.  Romberg is negative. Gait, station and balance: She stands easily. No veering to one side is noted. No leaning to one side is noted. Posture is age-appropriate and stance is narrow based. Gait shows normal stride length and normal pace. No problems turning are noted. Tandem walk is unremarkable.    Assessment and Plan:    In summary, Krystal Myers is a very pleasant 52 year old female with an underlying medical history of hypothyroidism, and  osteoarthritis, who presents for follow-up consultation of her hand tremors of about 5 or 6 years duration with a positive family history of tremors, affecting her father.  She feels  stable, and exam is quite benign. We mutually agreed to continue to monitor her exam findings and her symptoms.  We may consider a low-dose beta-blocker down the road, for now, we will have her come back in about a year, sooner if needed.  I would like for her to schedule a follow-up appointment routinely with one of our nurse practitioners.  We talked about the importance of managing known tremor triggers, she is reminded to stay well rested and well-hydrated, limit caffeine, limit alcohol use.  We talked about the importance of stress reduction. I answered all her questions today and she was in agreement with our plan. I spent 30 minutes in total face-to-face time and in reviewing records during pre-charting, more than 50% of which was spent in counseling and coordination of care, reviewing test results, reviewing medications and treatment regimen and/or in discussing or reviewing the diagnosis of ET, the prognosis and treatment options. Pertinent laboratory and imaging test results that were available during this visit with the patient were reviewed by me and considered in my medical decision making (see chart for details).

## 2023-02-20 ENCOUNTER — Other Ambulatory Visit: Payer: Self-pay | Admitting: Family

## 2023-02-20 DIAGNOSIS — I10 Essential (primary) hypertension: Secondary | ICD-10-CM

## 2023-03-23 ENCOUNTER — Other Ambulatory Visit: Payer: Self-pay | Admitting: Family

## 2023-03-23 DIAGNOSIS — I10 Essential (primary) hypertension: Secondary | ICD-10-CM

## 2023-04-09 ENCOUNTER — Encounter: Payer: 59 | Admitting: Family

## 2023-04-16 ENCOUNTER — Encounter: Payer: Self-pay | Admitting: Family

## 2023-04-16 ENCOUNTER — Ambulatory Visit (INDEPENDENT_AMBULATORY_CARE_PROVIDER_SITE_OTHER): Payer: 59 | Admitting: Family

## 2023-04-16 VITALS — BP 122/76 | HR 80 | Ht 64.0 in | Wt 150.2 lb

## 2023-04-16 DIAGNOSIS — Z13220 Encounter for screening for lipoid disorders: Secondary | ICD-10-CM

## 2023-04-16 DIAGNOSIS — I10 Essential (primary) hypertension: Secondary | ICD-10-CM | POA: Diagnosis not present

## 2023-04-16 DIAGNOSIS — E039 Hypothyroidism, unspecified: Secondary | ICD-10-CM

## 2023-04-16 DIAGNOSIS — Z23 Encounter for immunization: Secondary | ICD-10-CM

## 2023-04-16 DIAGNOSIS — Z0000 Encounter for general adult medical examination without abnormal findings: Secondary | ICD-10-CM | POA: Diagnosis not present

## 2023-04-16 MED ORDER — LOSARTAN POTASSIUM 25 MG PO TABS: 25.0000 mg | ORAL_TABLET | Freq: Every day | ORAL | 3 refills | Status: DC

## 2023-04-16 NOTE — Progress Notes (Signed)
Krystal Myers is a 52 y.o. female with the following history as recorded in [facility]:  Patient Active Problem List   Diagnosis Date Noted   FHx: colon cancer 12/23/2021   Osteoarthritis of distal interphalangeal (DIP) joint of left middle finger 09/19/2019   Routine general medical examination at a health care facility 01/13/2016   Chronic eczematous otitis externa of both ears 12/08/2012    Current Outpatient Medications  Medication Sig Dispense Refill   Cyanocobalamin (B-12 PO) Take by mouth daily.     levothyroxine (SYNTHROID) 88 MCG tablet TAKE 1 TABLET BY MOUTH EVERY DAY 90 tablet 1   neomycin-polymyxin-hydrocortisone (CORTISPORIN) OTIC solution Place 3 drops into both ears 4 (four) times daily. 10 mL 1   oxybutynin (DITROPAN-XL) 10 MG 24 hr tablet 15 mg.     VITAMIN D PO Take by mouth daily.     losartan (COZAAR) 25 MG tablet Take 1 tablet (25 mg total) by mouth daily. 90 tablet 3   No current facility-administered medications for this visit.    Allergies: Latex  Past Medical History:  Diagnosis Date   Essential tremor    History of chicken pox    Hypertension    Thyroid disease     Past Surgical History:  Procedure Laterality Date   BREAST ENHANCEMENT SURGERY     WISDOM TOOTH EXTRACTION Bilateral     Family History  Problem Relation Age of Onset   Healthy Mother    Hypertension Father    Hyperlipidemia Father    Tremor Father    Hypertension Sister    Colon cancer Brother 36   Healthy Maternal Grandmother    Healthy Maternal Grandfather    Healthy Paternal Grandmother    Healthy Paternal Grandfather    Stomach cancer Neg Hx    Esophageal cancer Neg Hx    Parkinson's disease Neg Hx     Social History   Tobacco Use   Smoking status: Former    Types: Cigarettes   Smokeless tobacco: Never   Tobacco comments:    Quit over 18 years ago  Substance Use Topics   Alcohol use: Yes    Alcohol/week: 5.0 standard drinks of alcohol    Types: 5 Glasses of  wine per week    Subjective:   Presents for yearly CPE; no acute concerns today; does see GYN and urology regularly; would like to get fasting labs;  Review of Systems  Constitutional: Negative.   HENT: Negative.    Eyes: Negative.   Respiratory: Negative.    Cardiovascular: Negative.   Gastrointestinal: Negative.   Genitourinary: Negative.   Musculoskeletal: Negative.   Skin: Negative.   Neurological: Negative.   Endo/Heme/Allergies: Negative.   Psychiatric/Behavioral: Negative.       Objective:  Vitals:   04/16/23 1317  BP: 122/76  Pulse: 80  SpO2: 99%  Weight: 150 lb 3.2 oz (68.1 kg)  Height: 5\' 4"  (1.626 m)    General: Well developed, well nourished, in no acute distress  Skin : Warm and dry.  Head: Normocephalic and atraumatic  Eyes: Sclera and conjunctiva clear; pupils round and reactive to light; extraocular movements intact  Ears: External normal; canals clear; tympanic membranes normal  Oropharynx: Pink, supple. No suspicious lesions  Neck: Supple without thyromegaly, adenopathy  Lungs: Respirations unlabored; clear to auscultation bilaterally without wheeze, rales, rhonchi  CVS exam: normal rate and regular rhythm.  Abdomen: Soft; nontender; nondistended; normoactive bowel sounds; no masses or hepatosplenomegaly  Musculoskeletal: No deformities; no active joint  inflammation  Extremities: No edema, cyanosis, clubbing  Vessels: Symmetric bilaterally  Neurologic: Alert and oriented; speech intact; face symmetrical; moves all extremities well; CNII-XII intact without focal deficit   Assessment:  1. PE (physical exam), annual   2. Lipid screening   3. Hypothyroidism, unspecified type   4. Essential hypertension     Plan:  Age appropriate preventive healthcare needs addressed; encouraged regular eye doctor and dental exams; encouraged regular exercise; will update labs and refills as needed today; follow-up to be determined; Tdap updated today;    No  follow-ups on file.  Orders Placed This Encounter  Procedures   CBC with Differential/Platelet   Comp Met (CMET)   Lipid panel   TSH    Requested Prescriptions   Signed Prescriptions Disp Refills   losartan (COZAAR) 25 MG tablet 90 tablet 3    Sig: Take 1 tablet (25 mg total) by mouth daily.

## 2023-04-16 NOTE — Addendum Note (Signed)
Addended by: Judieth Keens on: 04/16/2023 04:14 PM   Modules accepted: Orders

## 2023-04-17 LAB — CBC WITH DIFFERENTIAL/PLATELET
Absolute Lymphocytes: 2366 {cells}/uL (ref 850–3900)
Absolute Monocytes: 530 {cells}/uL (ref 200–950)
Basophils Absolute: 40 {cells}/uL (ref 0–200)
Basophils Relative: 0.7 %
Eosinophils Absolute: 103 {cells}/uL (ref 15–500)
Eosinophils Relative: 1.8 %
HCT: 40.5 % (ref 35.0–45.0)
Hemoglobin: 13.7 g/dL (ref 11.7–15.5)
MCH: 31.5 pg (ref 27.0–33.0)
MCHC: 33.8 g/dL (ref 32.0–36.0)
MCV: 93.1 fL (ref 80.0–100.0)
MPV: 10.2 fL (ref 7.5–12.5)
Monocytes Relative: 9.3 %
Neutro Abs: 2662 {cells}/uL (ref 1500–7800)
Neutrophils Relative %: 46.7 %
Platelets: 277 10*3/uL (ref 140–400)
RBC: 4.35 10*6/uL (ref 3.80–5.10)
RDW: 11.3 % (ref 11.0–15.0)
Total Lymphocyte: 41.5 %
WBC: 5.7 10*3/uL (ref 3.8–10.8)

## 2023-04-17 LAB — COMPREHENSIVE METABOLIC PANEL
AG Ratio: 1.7 (calc) (ref 1.0–2.5)
ALT: 14 U/L (ref 6–29)
AST: 21 U/L (ref 10–35)
Albumin: 4.5 g/dL (ref 3.6–5.1)
Alkaline phosphatase (APISO): 57 U/L (ref 37–153)
BUN: 18 mg/dL (ref 7–25)
CO2: 27 mmol/L (ref 20–32)
Calcium: 9.4 mg/dL (ref 8.6–10.4)
Chloride: 103 mmol/L (ref 98–110)
Creat: 0.6 mg/dL (ref 0.50–1.03)
Globulin: 2.6 g/dL (ref 1.9–3.7)
Glucose, Bld: 92 mg/dL (ref 65–99)
Potassium: 4.6 mmol/L (ref 3.5–5.3)
Sodium: 139 mmol/L (ref 135–146)
Total Bilirubin: 1.4 mg/dL — ABNORMAL HIGH (ref 0.2–1.2)
Total Protein: 7.1 g/dL (ref 6.1–8.1)

## 2023-04-17 LAB — LIPID PANEL
Cholesterol: 226 mg/dL — ABNORMAL HIGH (ref <200)
HDL: 89 mg/dL (ref >=50)
LDL Cholesterol (Calc): 122 mg/dL — ABNORMAL HIGH
Non-HDL Cholesterol (Calc): 137 mg/dL — ABNORMAL HIGH (ref <130)
Total CHOL/HDL Ratio: 2.5 (calc) (ref <5.0)
Triglycerides: 62 mg/dL (ref <150)

## 2023-04-17 LAB — TSH: TSH: 2.05 m[IU]/L

## 2023-04-20 ENCOUNTER — Encounter: Payer: Self-pay | Admitting: Family

## 2023-04-20 ENCOUNTER — Other Ambulatory Visit: Payer: Self-pay | Admitting: Family

## 2023-04-20 DIAGNOSIS — E039 Hypothyroidism, unspecified: Secondary | ICD-10-CM

## 2023-04-20 MED ORDER — LEVOTHYROXINE SODIUM 88 MCG PO TABS: 88.0000 ug | ORAL_TABLET | Freq: Every day | ORAL | 3 refills | Status: DC

## 2023-10-10 ENCOUNTER — Other Ambulatory Visit: Payer: Self-pay | Admitting: Family

## 2023-10-10 DIAGNOSIS — E039 Hypothyroidism, unspecified: Secondary | ICD-10-CM

## 2023-11-01 ENCOUNTER — Other Ambulatory Visit: Payer: Self-pay | Admitting: Obstetrics and Gynecology

## 2023-11-01 DIAGNOSIS — Z1231 Encounter for screening mammogram for malignant neoplasm of breast: Secondary | ICD-10-CM

## 2023-11-16 ENCOUNTER — Ambulatory Visit
Admission: RE | Admit: 2023-11-16 | Discharge: 2023-11-16 | Disposition: A | Discharge status: Home / Self Care | Source: Ambulatory Visit | Attending: Obstetrics and Gynecology | Admitting: Obstetrics and Gynecology

## 2023-11-16 DIAGNOSIS — Z1231 Encounter for screening mammogram for malignant neoplasm of breast: Secondary | ICD-10-CM

## 2024-02-08 ENCOUNTER — Ambulatory Visit (INDEPENDENT_AMBULATORY_CARE_PROVIDER_SITE_OTHER): Payer: 59 | Admitting: Neurology

## 2024-02-08 ENCOUNTER — Encounter: Payer: Self-pay | Admitting: Neurology

## 2024-02-08 VITALS — BP 136/87 | HR 67 | Ht 64.0 in | Wt 138.5 lb

## 2024-02-08 DIAGNOSIS — R251 Tremor, unspecified: Secondary | ICD-10-CM | POA: Diagnosis not present

## 2024-02-08 DIAGNOSIS — D225 Melanocytic nevi of trunk: Secondary | ICD-10-CM | POA: Insufficient documentation

## 2024-02-08 DIAGNOSIS — L821 Other seborrheic keratosis: Secondary | ICD-10-CM | POA: Insufficient documentation

## 2024-02-08 NOTE — Progress Notes (Signed)
 Patient: Krystal Myers Date of Birth: 23-Jul-1970  Reason for Visit: Follow up History from: Patient Primary Neurologist: Buck  ASSESSMENT AND PLAN 53 y.o. year old female   1.  Tremor - Stable, no tremor seen on exam today - May consider propranolol if worsens - Continue follow-up with primary care, remains on Synthroid  - Anxiety, caffeine, alcohol can worsen tremor  - Follow-up in 1 year or sooner if needed  HISTORY OF PRESENT ILLNESS: Today 02/08/24 02/08/24 SS: Tremor remains stable, both hands. Will notice in her hands when cold or nervous. Not worse in either hand. Is right handed. No issues with eating or handwriting. Her father had tremor before he passed. Will notices episodes of tremor when anxious or cold, last few minutes. Is looking for new PCP, on thyroid  medicine, still feels tired in the afternoon.   HISTORY 02/08/23 Dr. Buck: Krystal Myers is a 53 year old right-handed woman with an underlying medical history of hypothyroidism, and osteoarthritis, who presents for follow-up consultation of her essential tremor affecting both hands.  The patient is unaccompanied today.  I last saw her on 02/05/2022, at which time she reported feeling stable.  She had recent blood work with her primary care including thyroid  function test.  We mutually agreed to monitor her symptoms and exam and follow-up in 1 year.   Today, 02/08/2023: She reports doing well, no new issues, feels like her tremor has been stable, no issues with balance, no recent falls.  She is pending her physical for this year and blood work.  She tries to hydrate well but could do a little better.  She limits her caffeine.  She drinks alcohol in the form of wine, a couple of times a week.  Tremor is manageable, not bothersome, no problems doing her work related duties.  She reports that she has a habit of picking the skin around her nails and tapping her nails when she has fake nails on.  She also noticed that she  has tension in her thigh muscles especially when driving.  She has not had any other symptoms, we talked about the importance of physical and mental relaxation and how her symptoms are probably benign and finger tapping is a common habit.  REVIEW OF SYSTEMS: Out of a complete 14 system review of symptoms, the patient complains only of the following symptoms, and all other reviewed systems are negative.  See HPI  ALLERGIES: Allergies  Allergen Reactions   Latex Itching    HOME MEDICATIONS: Outpatient Medications Prior to Visit  Medication Sig Dispense Refill   Cyanocobalamin (B-12 PO) Take by mouth daily.     levothyroxine  (SYNTHROID ) 88 MCG tablet TAKE 1 TABLET BY MOUTH EVERY DAY 90 tablet 1   losartan  (COZAAR ) 25 MG tablet Take 1 tablet (25 mg total) by mouth daily. 90 tablet 3   oxybutynin (DITROPAN-XL) 10 MG 24 hr tablet 15 mg.     VITAMIN D  PO Take by mouth daily.     No facility-administered medications prior to visit.    PAST MEDICAL HISTORY: Past Medical History:  Diagnosis Date   Essential tremor    History of chicken pox    Hypertension    Thyroid  disease     PAST SURGICAL HISTORY: Past Surgical History:  Procedure Laterality Date   BREAST ENHANCEMENT SURGERY     WISDOM TOOTH EXTRACTION Bilateral     FAMILY HISTORY: Family History  Problem Relation Age of Onset   Healthy Mother    Hypertension Father  Hyperlipidemia Father    Tremor Father    Hypertension Sister    Colon cancer Brother 35   Healthy Maternal Grandmother    Healthy Maternal Grandfather    Healthy Paternal Grandmother    Healthy Paternal Grandfather    Stomach cancer Neg Hx    Esophageal cancer Neg Hx    Parkinson's disease Neg Hx     SOCIAL HISTORY: Social History   Socioeconomic History   Marital status: Single    Spouse name: Not on file   Number of children: 0   Years of education: 14   Highest education level: Associate degree: academic program  Occupational History    Occupation: Merchandiser, Retail  Tobacco Use   Smoking status: Former    Types: Cigarettes   Smokeless tobacco: Never   Tobacco comments:    Quit over 18 years ago  Vaping Use   Vaping status: Never Used  Substance and Sexual Activity   Alcohol use: Yes    Alcohol/week: 5.0 standard drinks of alcohol    Types: 5 Glasses of wine per week   Drug use: No   Sexual activity: Yes    Birth control/protection: Pill  Other Topics Concern   Not on file  Social History Narrative   Fun: Yard work, walk with her dogs   Denies abuse and feels safe at home.    Social Drivers of Corporate Investment Banker Strain: Low Risk  (04/09/2023)   Overall Financial Resource Strain (CARDIA)    Difficulty of Paying Living Expenses: Not hard at all  Food Insecurity: No Food Insecurity (04/09/2023)   Hunger Vital Sign    Worried About Running Out of Food in the Last Year: Never true    Ran Out of Food in the Last Year: Never true  Transportation Needs: No Transportation Needs (04/09/2023)   PRAPARE - Administrator, Civil Service (Medical): No    Lack of Transportation (Non-Medical): No  Physical Activity: Sufficiently Active (04/09/2023)   Exercise Vital Sign    Days of Exercise per Week: 5 days    Minutes of Exercise per Session: 30 min  Stress: No Stress Concern Present (04/09/2023)   [employer] of Occupational Health - Occupational Stress Questionnaire    Feeling of Stress : Only a little  Social Connections: Socially Isolated (04/09/2023)   Social Connection and Isolation Panel    Frequency of Communication with Friends and Family: Once a week    Frequency of Social Gatherings with Friends and Family: Once a week    Attends Religious Services: More than 4 times per year    Active Member of [employer] or Organizations: No    Attends Engineer, Structural: Not on file    Marital Status: Never married  Intimate Partner Violence: Not on file   PHYSICAL EXAM  Vitals:   02/08/24 0754   BP: 136/87  Pulse: 67  SpO2: 98%  Weight: 138 lb 8 oz (62.8 kg)  Height: 5' 4 (1.626 m)   Body mass index is 23.77 kg/m.  Generalized: Well developed, in no acute distress  Neurological examination  Mentation: Alert oriented to time, place, history taking. Follows all commands speech and language fluent Cranial nerve II-XII: Pupils were equal round reactive to light. Extraocular movements were full, visual field were full on confrontational test. Facial sensation and strength were normal.  Head turning and shoulder shrug  were normal and symmetric. Motor: The motor testing reveals 5 over 5 strength of all 4  extremities. Good symmetric motor tone is noted throughout.  Sensory: Sensory testing is intact to soft touch on all 4 extremities. No evidence of extinction is noted.  Coordination: Cerebellar testing reveals good finger-nose-finger and heel-to-shin bilaterally.  No tremor seen. Gait and station: Gait is normal.   DIAGNOSTIC DATA (LABS, IMAGING, TESTING) - I reviewed patient records, labs, notes, testing and imaging myself where available.  Lab Results  Component Value Date   WBC 5.7 04/16/2023   HGB 13.7 04/16/2023   HCT 40.5 04/16/2023   MCV 93.1 04/16/2023   PLT 277 04/16/2023      Component Value Date/Time   NA 139 04/16/2023 1416   K 4.6 04/16/2023 1416   CL 103 04/16/2023 1416   CO2 27 04/16/2023 1416   GLUCOSE 92 04/16/2023 1416   BUN 18 04/16/2023 1416   CREATININE 0.60 04/16/2023 1416   CALCIUM 9.4 04/16/2023 1416   PROT 7.1 04/16/2023 1416   ALBUMIN 4.3 12/23/2021 0908   AST 21 04/16/2023 1416   ALT 14 04/16/2023 1416   ALKPHOS 56 12/23/2021 0908   BILITOT 1.4 (H) 04/16/2023 1416   Lab Results  Component Value Date   CHOL 226 (H) 04/16/2023   HDL 89 04/16/2023   LDLCALC 122 (H) 04/16/2023   TRIG 62 04/16/2023   CHOLHDL 2.5 04/16/2023   No results found for: HGBA1C Lab Results  Component Value Date   VITAMINB12 284 11/01/2020   Lab Results   Component Value Date   TSH 2.05 04/16/2023   Lauraine Born, AGNP-C, DNP 02/08/2024, 7:58 AM [facility] [address], [address] [place], [place] [address] [phone]

## 2024-02-08 NOTE — Patient Instructions (Signed)
 Great to see you today! Continue follow-up with primary care Continue to monitor tremor Call for worsening symptoms Follow-up 1 year.  Thanks!!

## 2024-04-18 ENCOUNTER — Ambulatory Visit: Admitting: Physician Assistant

## 2024-04-18 VITALS — BP 125/79 | HR 62 | Temp 98.0°F | Resp 18 | Ht 64.0 in | Wt 138.2 lb

## 2024-04-18 DIAGNOSIS — I10 Essential (primary) hypertension: Secondary | ICD-10-CM | POA: Diagnosis not present

## 2024-04-18 DIAGNOSIS — Z0000 Encounter for general adult medical examination without abnormal findings: Secondary | ICD-10-CM | POA: Diagnosis not present

## 2024-04-18 DIAGNOSIS — R251 Tremor, unspecified: Secondary | ICD-10-CM | POA: Diagnosis not present

## 2024-04-18 DIAGNOSIS — N3281 Overactive bladder: Secondary | ICD-10-CM | POA: Diagnosis not present

## 2024-04-18 DIAGNOSIS — Z13220 Encounter for screening for lipoid disorders: Secondary | ICD-10-CM | POA: Diagnosis not present

## 2024-04-18 DIAGNOSIS — E039 Hypothyroidism, unspecified: Secondary | ICD-10-CM | POA: Insufficient documentation

## 2024-04-18 DIAGNOSIS — K5900 Constipation, unspecified: Secondary | ICD-10-CM | POA: Insufficient documentation

## 2024-04-18 MED ORDER — LEVOTHYROXINE SODIUM 88 MCG PO TABS: 88.0000 ug | ORAL_TABLET | Freq: Every day | ORAL | 1 refills | Status: AC

## 2024-04-18 MED ORDER — LOSARTAN POTASSIUM 25 MG PO TABS: 25.0000 mg | ORAL_TABLET | Freq: Every day | ORAL | 3 refills | Status: AC

## 2024-04-18 NOTE — Assessment & Plan Note (Signed)
-   Managed with thyroid  replacement therapy. Thyroid  levels stable since 2022.  - Continue thyroid  replacement therapy.

## 2024-04-18 NOTE — Assessment & Plan Note (Signed)
 Emphasized the importance of increased water intake.  Can progress to stool softeners if needed, but suspect that this is easily reversed with improved hydration.

## 2024-04-18 NOTE — Assessment & Plan Note (Signed)
-   Hypertension well-controlled with losartan . Blood pressure stable during visit.  - Continue losartan  for blood pressure management. - Ordered labs to check renal function.

## 2024-04-18 NOTE — Assessment & Plan Note (Signed)
 Stable, no medications. Followed by neurology.

## 2024-04-18 NOTE — Assessment & Plan Note (Signed)
 Stable on oxybutynin .  Follows with urology.

## 2024-04-18 NOTE — Patient Instructions (Addendum)
" °  VISIT SUMMARY: Today, you had a transition of care visit and annual physical exam. We discussed your current health status, family history, and lifestyle habits. We also reviewed your medications and any ongoing health concerns.  YOUR PLAN: -ANNUAL PHYSICAL EXAMINATION AND PREVENTIVE CARE: Your routine exam showed no acute concerns. We discussed your family history of colon cancer, your alcohol consumption, and your exercise and diet habits. Occasional constipation is likely due to dehydration. We ordered routine blood tests and screenings, and I encouraged you to maintain regular exercise, a balanced diet, and proper hydration to prevent constipation.  -ESSENTIAL HYPERTENSION: Your high blood pressure is well-controlled with losartan , which also helps protect your kidneys. Your blood pressure was stable during the visit. We will continue with your current medication and have ordered labs to check your kidney function.  -HYPOTHYROIDISM: Your thyroid  condition is managed with thyroid  replacement therapy, and your levels have been stable since 2022. The occasional fatigue you experience may be unrelated. We will continue with your current therapy and monitor for any changes in symptoms or thyroid  levels.  -LIPID SCREENING: We are conducting a routine lipid screening as part of your annual physical exam to check your cholesterol levels. There have been no specific abnormalities reported.  INSTRUCTIONS: Please follow up with the ordered lab tests, including CBC, CMP, TSH, lipid panel, A1c, hepatitis B and C screening, and HIV screening. Continue with your annual mammogram and Pap smear as per guidelines. Maintain regular exercise, a balanced diet, and stay hydrated to prevent constipation. Continue taking losartan  for blood pressure management and your thyroid  replacement therapy. Monitor for any changes in symptoms or thyroid  levels.    Contains text generated by [employer].   "

## 2024-04-18 NOTE — Progress Notes (Signed)
 "  Complete physical exam  Patient: Krystal Myers   DOB: 1970-04-27   54 y.o. Female  MRN: [id number]  Subjective:    Chief Complaint  Patient presents with   transfer of care    Krystal Myers is a 54 y.o. female who presents today for a complete physical exam.   Work: primary school teacher. Currently lives with: alone. Vision concerns: Managed with optometry. Recently starting wearing glasses. Dental concerns: Receives regular care. Dental implant scheduled next month. STD concerns: Denies any concerns, not currently sexually active. ETOH use: 1 glass of red wine every night. Nicotine use: No, 2.5 pack year smoking history from early adulthood. Recreational drugs/illegal substances: No. OB/GYN: Sees Dorthea Just with [facility].  Discussed the use of AI scribe software for clinical note transcription with the patient, who gave verbal consent to proceed.  History of Present Illness Krystal Myers is a 54 year old female who presents for a transition of care visit and annual physical exam.  She wears glasses with a new prescription obtained a few months ago for both distance and reading. She does not wear contact lenses regularly and uses glasses mainly when working on the computer.   Patient receives regular dental care.  She is in the process of getting a dental implant and has a follow-up appointment in February for measurements.  She also reports an Invis-Align.   She drinks a glass of red wine, specifically Pinot Noir, with dinner every night. She quit smoking at age 59 after smoking about half a pack a day for a few years.  Her brother was diagnosed with colon cancer in his mid-sixties, prompting her and her siblings to undergo colonoscopies. She previously used Cologuard but switched to regular colonoscopies after her brother's diagnosis.  She has a bump on her left middle finger, which was previously evaluated and described as a small  dorsal osteophyte. She also has a ganglion cyst on her left volar wrist, which was evaluated by a specialist.  She was diagnosed with essential tremors by a neurologist.  It is stable and she does not require medications.  She was started on thyroid  replacement therapy after experiencing fatigue, particularly in the afternoons. Her thyroid  levels have stabilized since starting the medication.  She takes losartan  for hypertension, which is well-controlled. She does not regularly monitor her blood pressure at home.  She takes oxybutynin for a weak urethral sphincter and overactive bladder, managed by a urologist. She has tried other medications but found them too expensive without a generic option.  She experiences occasional constipation, which she attributes to inadequate water intake. She admits to being poor at drinking water, preferring chocolate milk and wine.  She has a history of eczema in her ears, previously managed with steroid drops, which she has not used recently.    Most recent fall risk assessment:    04/18/2024    3:24 PM  Fall Risk   Falls in the past year? 0  Number falls in past yr: 0  Injury with Fall? 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 09/19/2019   Finding of above normal blood pressure 04/20/2018   Stress incontinence of urine 04/20/2018   Routine general medical examination at a health care facility  01/13/2016   Past Medical History:  Diagnosis Date   Essential tremor    History of chicken pox    Hypertension    Thyroid  disease    Past Surgical History:  Procedure Laterality Date   BREAST ENHANCEMENT SURGERY     WISDOM TOOTH EXTRACTION Bilateral    Social History[1] Social History   Socioeconomic History   Marital status: Single    Spouse name: Not on file   Number of children: 0   Years of education: 14   Highest education level: Associate degree: occupational, scientist, product/process development, or vocational program  Occupational History   Occupation: Merchandiser, Retail  Tobacco Use   Smoking status: Former    Types: Cigarettes   Smokeless tobacco: Never   Tobacco comments:    Quit over 18 years ago  Vaping Use   Vaping status: Never Used  Substance and Sexual Activity   Alcohol use: Yes    Alcohol/week: 5.0 standard drinks of alcohol    Types: 5 Glasses of wine per week   Drug use: No   Sexual activity: Yes    Birth control/protection: Pill  Other Topics Concern   Not on file  Social History Narrative   Fun: Yard work, walk with her dogs   Denies abuse and feels safe at home.    Social Drivers of Health   Tobacco Use: Medium Risk (04/18/2024)   Patient History    Smoking Tobacco Use: Former    Smokeless Tobacco Use: Never    Passive Exposure: Not on file  Financial Resource Strain: Low Risk (04/18/2024)   Overall Financial Resource Strain (CARDIA)    Difficulty of Paying Living Expenses: Not hard at all  Food Insecurity: No Food Insecurity (04/18/2024)   Epic    Worried About Programme Researcher, Broadcasting/film/video in the Last Year: Never true    Ran Out of Food in the Last Year: Never true  Transportation Needs: No Transportation Needs (04/18/2024)   Epic    Lack of Transportation (Medical): No    Lack of Transportation (Non-Medical): No  Physical Activity: Sufficiently Active (04/18/2024)   Exercise Vital Sign    Days of Exercise per Week: 5 days    Minutes of Exercise per Session: 30 min   Stress: No Stress Concern Present (04/18/2024)   [employer] of Occupational Health - Occupational Stress Questionnaire    Feeling of Stress: Only a little  Social Connections: Unknown (04/18/2024)   Social Connection and Isolation Panel    Frequency of Communication with Friends and Family: Once a week    Frequency of Social Gatherings with Friends and Family: Patient declined    Attends Religious Services: More than 4 times per year    Active Member of Clubs or Organizations: Yes    Attends Banker Meetings: 1 to 4 times per year    Marital Status: Never married  Intimate Partner Violence: Not on file  Depression (PHQ2-9): Low Risk (04/18/2024)   Depression (PHQ2-9)    PHQ-2 Score: 0  Alcohol Screen: Low Risk (04/18/2024)   Alcohol Screen  Last Alcohol Screening Score (AUDIT): 4  Housing: Low Risk (04/18/2024)   Epic    Unable to Pay for Housing in the Last Year: No    Number of Times Moved in the Last Year: 0    Homeless in the Last Year: No  Utilities: Not on file  Health Literacy: Not on file   Family Status  Relation Name Status   Mother  (Not Specified)   Father  (Not Specified)   Sister  (Not Specified)   Brother  (Not Specified)   MGM  (Not Specified)   MGF  (Not Specified)   PGM  (Not Specified)   PGF  (Not Specified)   Neg Hx  (Not Specified)  No partnership data on file   Family History  Problem Relation Age of Onset   Healthy Mother    Hypertension Father    Hyperlipidemia Father    Tremor Father    Hypertension Sister    Colon cancer Brother 46   Healthy Maternal Grandmother    Healthy Maternal Grandfather    Healthy Paternal Grandmother    Healthy Paternal Grandfather    Stomach cancer Neg Hx    Esophageal cancer Neg Hx    Parkinson's disease Neg Hx    Allergies[2]    Patient Care Team: Jason Leita Repine, FNP (Inactive) as PCP - General (Internal Medicine)   Show/hide medication list[3]  ROS  See HPI, all else  negative.       Objective:     BP 125/79   Pulse 62   Temp 98 F (36.7 C)   Resp 18   Ht 5' 4 (1.626 m)   Wt 138 lb 3.2 oz (62.7 kg)   LMP 10/01/2018   SpO2 99%   BMI 23.72 kg/m    Physical Exam Constitutional:      Appearance: Normal appearance.  HENT:     Head: Normocephalic and atraumatic.     Right Ear: Tympanic membrane normal.     Left Ear: Tympanic membrane normal.     Ears:     Comments: Canals are normal and without any cerumen, inflammation, or erythema.    Mouth/Throat:     Pharynx: Oropharynx is clear.  Eyes:     Extraocular Movements: Extraocular movements intact.     Pupils: Pupils are equal, round, and reactive to light.  Cardiovascular:     Rate and Rhythm: Normal rate and regular rhythm.     Pulses: Normal pulses.  Pulmonary:     Effort: Pulmonary effort is normal. No respiratory distress.     Breath sounds: Normal breath sounds.  Abdominal:     General: There is no distension.     Palpations: Abdomen is soft.     Tenderness: There is no abdominal tenderness.  Genitourinary:    Comments: Deferred Musculoskeletal:        General: Normal range of motion.     Cervical back: Normal range of motion.     Comments: Nodule on left third finger DIP consistent with osteophyte seen on plain films. Ganglion cyst volar aspect of left wrist.  Lymphadenopathy:     Cervical: No cervical adenopathy.  Skin:    General: Skin is warm.  Neurological:     General: No focal deficit present.     Mental Status: She is alert and oriented to person, place, and time.  Psychiatric:        Behavior: Behavior normal.      No results found for any visits  on 04/18/24.     Assessment & Plan:    Routine Health Maintenance and Physical Exam Discussed health promotion and safety including diet and exercise recommendations, dental health, and injury prevention. Tobacco cessation if applicable. Seat belts, sunscreen, smoke detectors, etc.    Immunization History   Administered Date(s) Administered   Tdap 12/08/2012, 04/16/2023    Health Maintenance  Topic Date Due   HIV Screening  Never done   Hepatitis C Screening  Never done   Hepatitis B Vaccines 19-59 Average Risk (1 of 3 - 19+ 3-dose series) Never done   Cervical Cancer Screening (HPV/Pap Cotest)  04/25/2022   Influenza Vaccine  06/27/2024 (Originally 10/29/2023)   Pneumococcal Vaccine: 50+ Years (1 of 1 - PCV) 04/18/2025 (Originally 10/07/2018)   Mammogram  11/15/2025   Colonoscopy  01/01/2027   DTaP/Tdap/Td (3 - Td or Tdap) 04/15/2033   HPV VACCINES (No Doses Required) Completed   Meningococcal B Vaccine  Aged Out   COVID-19 Vaccine  Discontinued   Fecal DNA (Cologuard)  Discontinued   Zoster Vaccines- Shingrix  Discontinued        Problem List Items Addressed This Visit       Cardiovascular and Mediastinum   Essential hypertension   - Hypertension well-controlled with losartan . Blood pressure stable during visit.  - Continue losartan  for blood pressure management. - Ordered labs to check renal function.      Relevant Medications   losartan  (COZAAR ) 25 MG tablet   Other Relevant Orders   CBC with Differential/Platelet   Comprehensive metabolic panel with GFR   Hemoglobin A1c     Endocrine   Hypothyroidism - Primary   - Managed with thyroid  replacement therapy. Thyroid  levels stable since 2022.  - Continue thyroid  replacement therapy.      Relevant Medications   levothyroxine  (SYNTHROID ) 88 MCG tablet   Other Relevant Orders   TSH     Genitourinary   Overactive bladder   Stable on oxybutynin. Follows with urology.        Other   Tremor   Stable, no medications. Followed by neurology.      Constipation   Emphasized the importance of increased water intake.  Can progress to stool softeners if needed, but suspect that this is easily reversed with improved hydration.      Other Visit Diagnoses       Lipid screening       Relevant Orders   Lipid  panel     Annual physical exam       Relevant Orders   HIV antibody (with reflex)   Hepatitis C Antibody       Recommend that most people either abstain from alcohol or drink within safe limits (<=14/week and <=4 drinks/occasion for males, <=7/weeks and <= 3 drinks/occasion for females) and that the risk for alcohol disorders and other health effects rises proportionally with the number of drinks per week and how often a drinker exceeds daily limits.  Diet: Recommend to adjust caloric intake to maintain or achieve ideal body weight, to reduce intake of dietary saturated fat and total fat, to limit sodium intake by avoiding high sodium foods and not adding table salt, and to maintain adequate dietary potassium and calcium preferably from fresh fruits, vegetables, and low-fat dairy products.   Emphasized the importance of regular exercise.  Injury prevention: Recommend seatbelts, safety helmets, smoke detector, etc..   Dental health: Recommend regular tooth brushing, flossing, and dental visits.  Return in about 1 year (around  04/18/2025).     Honora Seip, PA-C     [1]  Social History Tobacco Use   Smoking status: Former    Types: Cigarettes   Smokeless tobacco: Never   Tobacco comments:    Quit over 18 years ago  Vaping Use   Vaping status: Never Used  Substance Use Topics   Alcohol use: Yes    Alcohol/week: 5.0 standard drinks of alcohol    Types: 5 Glasses of wine per week   Drug use: No  [2]  Allergies Allergen Reactions   Latex Itching  [3]  Outpatient Medications Prior to Visit  Medication Sig   Cyanocobalamin  (B-12 PO) Take by mouth daily.   oxybutynin (DITROPAN-XL) 10 MG 24 hr tablet 15 mg.   VITAMIN D  PO Take by mouth daily.   [DISCONTINUED] levothyroxine  (SYNTHROID ) 88 MCG tablet TAKE 1 TABLET BY MOUTH EVERY DAY   [DISCONTINUED] losartan  (COZAAR ) 25 MG tablet Take 1 tablet (25 mg total) by mouth daily.   No facility-administered medications prior to  visit.   "

## 2024-04-19 ENCOUNTER — Other Ambulatory Visit: Payer: Self-pay | Admitting: *Deleted

## 2024-04-19 ENCOUNTER — Ambulatory Visit: Payer: Self-pay | Admitting: Physician Assistant

## 2024-04-19 DIAGNOSIS — R17 Unspecified jaundice: Secondary | ICD-10-CM

## 2024-04-19 LAB — LIPID PANEL
Cholesterol: 232 mg/dL — ABNORMAL HIGH (ref 28–200)
HDL: 105.1 mg/dL
LDL Cholesterol: 116 mg/dL — ABNORMAL HIGH (ref 10–99)
NonHDL: 127.19
Total CHOL/HDL Ratio: 2
Triglycerides: 58 mg/dL (ref 10.0–149.0)
VLDL: 11.6 mg/dL (ref 0.0–40.0)

## 2024-04-19 LAB — HIV ANTIBODY (ROUTINE TESTING W REFLEX)
HIV 1&2 Ab, 4th Generation: NONREACTIVE
HIV FINAL INTERPRETATION: NEGATIVE

## 2024-04-19 LAB — CBC WITH DIFFERENTIAL/PLATELET
Basophils Absolute: 0 K/uL (ref 0.0–0.1)
Basophils Relative: 0.7 % (ref 0.0–3.0)
Eosinophils Absolute: 0.1 K/uL (ref 0.0–0.7)
Eosinophils Relative: 1.4 % (ref 0.0–5.0)
HCT: 39.6 % (ref 36.0–46.0)
Hemoglobin: 13.3 g/dL (ref 12.0–15.0)
Lymphocytes Relative: 47.2 % — ABNORMAL HIGH (ref 12.0–46.0)
Lymphs Abs: 2.5 K/uL (ref 0.7–4.0)
MCHC: 33.6 g/dL (ref 30.0–36.0)
MCV: 93.4 fl (ref 78.0–100.0)
Monocytes Absolute: 0.5 K/uL (ref 0.1–1.0)
Monocytes Relative: 8.7 % (ref 3.0–12.0)
Neutro Abs: 2.2 K/uL (ref 1.4–7.7)
Neutrophils Relative %: 42 % — ABNORMAL LOW (ref 43.0–77.0)
Platelets: 240 K/uL (ref 150.0–400.0)
RBC: 4.23 Mil/uL (ref 3.87–5.11)
RDW: 12.3 % (ref 11.5–15.5)
WBC: 5.2 K/uL (ref 4.0–10.5)

## 2024-04-19 LAB — COMPREHENSIVE METABOLIC PANEL WITH GFR
ALT: 14 U/L (ref 3–35)
AST: 22 U/L (ref 5–37)
Albumin: 4.5 g/dL (ref 3.5–5.2)
Alkaline Phosphatase: 52 U/L (ref 39–117)
BUN: 17 mg/dL (ref 6–23)
CO2: 31 meq/L (ref 19–32)
Calcium: 9.4 mg/dL (ref 8.4–10.5)
Chloride: 101 meq/L (ref 96–112)
Creatinine, Ser: 0.64 mg/dL (ref 0.40–1.20)
GFR: 99.41 mL/min
Glucose, Bld: 87 mg/dL (ref 70–99)
Potassium: 4 meq/L (ref 3.5–5.1)
Sodium: 138 meq/L (ref 135–145)
Total Bilirubin: 1.9 mg/dL — ABNORMAL HIGH (ref 0.2–1.2)
Total Protein: 7 g/dL (ref 6.0–8.3)

## 2024-04-19 LAB — TSH: TSH: 2.86 u[IU]/mL (ref 0.35–5.50)

## 2024-04-19 LAB — HEMOGLOBIN A1C: Hgb A1c MFr Bld: 5.7 % (ref 4.6–6.5)

## 2024-04-19 LAB — HEPATITIS C ANTIBODY: Hepatitis C Ab: NONREACTIVE

## 2024-04-19 NOTE — Progress Notes (Signed)
 Opened in error

## 2024-05-11 ENCOUNTER — Ambulatory Visit (HOSPITAL_BASED_OUTPATIENT_CLINIC_OR_DEPARTMENT_OTHER)

## 2025-02-13 ENCOUNTER — Ambulatory Visit: Admitting: Neurology

## 2025-04-23 ENCOUNTER — Encounter: Admitting: Physician Assistant

## 2025-04-23 ENCOUNTER — Encounter: Admitting: Internal Medicine
